# Patient Record
Sex: Female | Born: 1985 | Race: White | Hispanic: No | Marital: Single | Smoking: Never smoker
Health system: Southern US, Community
[De-identification: ages and names within clinical notes are randomized; demographics above are authoritative.]

## PROBLEM LIST (undated history)

## (undated) ENCOUNTER — Inpatient Hospital Stay (HOSPITAL_COMMUNITY): Payer: Self-pay

## (undated) DIAGNOSIS — S060X9A Concussion with loss of consciousness of unspecified duration, initial encounter: Secondary | ICD-10-CM

## (undated) DIAGNOSIS — Z87442 Personal history of urinary calculi: Secondary | ICD-10-CM

## (undated) DIAGNOSIS — Z8759 Personal history of other complications of pregnancy, childbirth and the puerperium: Secondary | ICD-10-CM

## (undated) DIAGNOSIS — B009 Herpesviral infection, unspecified: Secondary | ICD-10-CM

## (undated) DIAGNOSIS — O99891 Other specified diseases and conditions complicating pregnancy: Secondary | ICD-10-CM

## (undated) DIAGNOSIS — A749 Chlamydial infection, unspecified: Secondary | ICD-10-CM

## (undated) DIAGNOSIS — N200 Calculus of kidney: Secondary | ICD-10-CM

## (undated) DIAGNOSIS — F419 Anxiety disorder, unspecified: Secondary | ICD-10-CM

## (undated) DIAGNOSIS — E282 Polycystic ovarian syndrome: Secondary | ICD-10-CM

## (undated) DIAGNOSIS — E88819 Insulin resistance, unspecified: Secondary | ICD-10-CM

## (undated) DIAGNOSIS — Z9889 Other specified postprocedural states: Secondary | ICD-10-CM

## (undated) DIAGNOSIS — K589 Irritable bowel syndrome without diarrhea: Secondary | ICD-10-CM

## (undated) DIAGNOSIS — E669 Obesity, unspecified: Secondary | ICD-10-CM

## (undated) HISTORY — DX: Irritable bowel syndrome, unspecified: K58.9

## (undated) HISTORY — PX: BARIATRIC SURGERY: SHX1103

## (undated) HISTORY — PX: FINGER SURGERY: SHX640

## (undated) HISTORY — DX: Insulin resistance, unspecified: E88.819

## (undated) HISTORY — PX: BREAST ENHANCEMENT SURGERY: SHX7

## (undated) HISTORY — DX: Personal history of other complications of pregnancy, childbirth and the puerperium: Z87.59

## (undated) HISTORY — PX: WISDOM TOOTH EXTRACTION: SHX21

## (undated) HISTORY — DX: Herpesviral infection, unspecified: B00.9

## (undated) HISTORY — DX: Obesity, unspecified: E66.9

## (undated) HISTORY — DX: Chlamydial infection, unspecified: A74.9

## (undated) HISTORY — DX: Anxiety disorder, unspecified: F41.9

---

## 2009-04-13 DIAGNOSIS — A749 Chlamydial infection, unspecified: Secondary | ICD-10-CM

## 2009-04-13 HISTORY — DX: Chlamydial infection, unspecified: A74.9

## 2011-07-27 ENCOUNTER — Encounter: Payer: Self-pay | Admitting: Obstetrics & Gynecology

## 2011-07-27 ENCOUNTER — Ambulatory Visit (INDEPENDENT_AMBULATORY_CARE_PROVIDER_SITE_OTHER): Payer: BC Managed Care – PPO | Admitting: Obstetrics & Gynecology

## 2011-07-27 VITALS — BP 127/72 | HR 85 | Temp 98.5°F | Resp 16 | Ht 63.0 in | Wt 218.0 lb

## 2011-07-27 DIAGNOSIS — Z23 Encounter for immunization: Secondary | ICD-10-CM

## 2011-07-27 DIAGNOSIS — Z113 Encounter for screening for infections with a predominantly sexual mode of transmission: Secondary | ICD-10-CM

## 2011-07-27 DIAGNOSIS — N915 Oligomenorrhea, unspecified: Secondary | ICD-10-CM

## 2011-07-27 DIAGNOSIS — Z124 Encounter for screening for malignant neoplasm of cervix: Secondary | ICD-10-CM

## 2011-07-27 DIAGNOSIS — Z0000 Encounter for general adult medical examination without abnormal findings: Secondary | ICD-10-CM

## 2011-07-27 DIAGNOSIS — N912 Amenorrhea, unspecified: Secondary | ICD-10-CM

## 2011-07-27 DIAGNOSIS — Z01419 Encounter for gynecological examination (general) (routine) without abnormal findings: Secondary | ICD-10-CM

## 2011-07-27 NOTE — Progress Notes (Signed)
Subjective:    Adrienne Madden is a 26 y.o. female who presents for an annual exam. She would like to be pregnant. She quit her OCPs in 11/12 and hasn't had a period since. Normally, without OCPs she will have a period about every other month. The patient is sexually active. GYN screening history: last pap: was normal. The patient wears seatbelts: yes. The patient participates in regular exercise: yes. Has the patient ever been transfused or tattooed?: not asked. The patient reports that there is not domestic violence in her life.   Menstrual History: OB History    Grav Para Term Preterm Abortions TAB SAB Ect Mult Living   0 0 0 0 0 0 0 0 0 0       Menarche age: 66 Patient's last menstrual period was 02/16/2011.    The following portions of the patient's history were reviewed and updated as appropriate: allergies, current medications, past family history, past medical history, past social history, past surgical history and problem list.  Review of Systems A comprehensive review of systems was negative. Her wedding is scheduled for 10/13. She is a Production designer, theatre/television/film at a [employer]. She recently had her cholesterol checked and says it was normal. She was told in high school that she has high testosterone. She has to pluck hair above upper lip and has extra hair on her lower abdomen.   Objective:    BP 127/72  Pulse 85  Temp(Src) 98.5 F (36.9 C) (Oral)  Resp 16  Ht 5\' 3"  (1.6 m)  Wt 218 lb (98.884 kg)  BMI 38.62 kg/m2  LMP 02/16/2011  General Appearance:    Alert, cooperative, no distress, appears stated age  Head:    Normocephalic, without obvious abnormality, atraumatic  Eyes:    PERRL, conjunctiva/corneas clear, EOM's intact, fundi    benign, both eyes  Ears:    Normal TM's and external ear canals, both ears  Nose:   Nares normal, septum midline, mucosa normal, no drainage    or sinus tenderness  Throat:   Lips, mucosa, and tongue normal; teeth and gums normal  Neck:   Supple, symmetrical,  trachea midline, no adenopathy;    thyroid:  no enlargement/tenderness/nodules; no carotid   bruit or JVD  Back:     Symmetric, no curvature, ROM normal, no CVA tenderness  Lungs:     Clear to auscultation bilaterally, respirations unlabored  Chest Wall:    No tenderness or deformity   Heart:    Regular rate and rhythm, S1 and S2 normal, no murmur, rub   or gallop  Breast Exam:    No tenderness, masses, or nipple abnormality  Abdomen:     Soft, non-tender, bowel sounds active all four quadrants,    no masses, no organomegaly  Genitalia:    Normal female without lesion, discharge or tenderness  Rectal:    Normal tone, normal prostate, no masses or tenderness;   guaiac negative stool  Extremities:   Extremities normal, atraumatic, no cyanosis or edema  Pulses:   2+ and symmetric all extremities  Skin:   Skin color, texture, turgor normal, no rashes or lesions  Lymph nodes:   Cervical, supraclavicular, and axillary nodes normal  Neurologic:   CNII-XII intact, normal strength, sensation and reflexes    throughout  .    Assessment:    Healthy female exam.  Probable PCOS   Plan:     Pap smear.  Labs She will RTC in 2 weeks and I will probably start her  on metformin for ovulation induction.

## 2011-07-28 LAB — TSH: TSH: 0.538 u[IU]/mL (ref 0.350–4.500)

## 2011-07-28 LAB — HCG, QUANTITATIVE, PREGNANCY: hCG, Beta Chain, Quant, S: <2 m[IU]/mL

## 2011-07-28 LAB — FOLLICLE STIMULATING HORMONE: FSH: 5.3 m[IU]/mL

## 2011-07-28 LAB — TESTOSTERONE, FREE, TOTAL, SHBG: Testosterone-% Free: 2.3 % (ref 0.4–2.4)

## 2011-07-29 ENCOUNTER — Encounter: Payer: Self-pay | Admitting: Obstetrics & Gynecology

## 2011-08-18 ENCOUNTER — Ambulatory Visit (INDEPENDENT_AMBULATORY_CARE_PROVIDER_SITE_OTHER): Payer: BC Managed Care – PPO | Admitting: Obstetrics & Gynecology

## 2011-08-18 ENCOUNTER — Encounter: Payer: Self-pay | Admitting: Obstetrics & Gynecology

## 2011-08-18 VITALS — BP 126/81 | HR 74 | Ht 64.0 in | Wt 222.0 lb

## 2011-08-18 DIAGNOSIS — N970 Female infertility associated with anovulation: Secondary | ICD-10-CM

## 2011-08-18 DIAGNOSIS — E282 Polycystic ovarian syndrome: Secondary | ICD-10-CM

## 2011-08-18 MED ORDER — METFORMIN HCL 500 MG PO TABS: ORAL_TABLET | ORAL | Status: DC

## 2011-08-18 NOTE — Progress Notes (Signed)
  Subjective:    Patient ID: Adrienne Madden, female    DOB: [birth year], 26 y.o.   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 RTC 6 months I have encouraged weight loss.

## 2011-09-28 ENCOUNTER — Ambulatory Visit: Payer: BC Managed Care – PPO

## 2011-10-05 ENCOUNTER — Ambulatory Visit: Payer: BC Managed Care – PPO | Admitting: Family

## 2011-10-05 DIAGNOSIS — Z3009 Encounter for other general counseling and advice on contraception: Secondary | ICD-10-CM

## 2012-06-16 ENCOUNTER — Ambulatory Visit (INDEPENDENT_AMBULATORY_CARE_PROVIDER_SITE_OTHER): Payer: BC Managed Care – PPO | Admitting: Obstetrics & Gynecology

## 2012-06-16 ENCOUNTER — Encounter: Payer: Self-pay | Admitting: Obstetrics & Gynecology

## 2012-06-16 VITALS — BP 134/89 | HR 95 | Resp 16 | Ht 65.0 in | Wt 209.0 lb

## 2012-06-16 MED ORDER — METFORMIN HCL 500 MG PO TABS: ORAL_TABLET | ORAL | Status: DC

## 2012-06-16 MED ORDER — DROSPIRENONE-ETHINYL ESTRADIOL 3-0.02 MG PO TABS: 1.0000 | ORAL_TABLET | Freq: Every day | ORAL | Status: DC

## 2012-06-16 NOTE — Progress Notes (Signed)
  Subjective:    Patient ID: Adrienne Madden, female    DOB: 03/31/1986, 27 y.o.   MRN: [id number]  HPI  [name] is a SW 27 yo G0 who is on metformin for PCOS. She is no longer with her previous finance and has a new boyfriend. She is using condoms but he recently tested negative for STIs and they want to forego condoms. So she is here today to start on OCPs. We discussed the various types of contraception, and she does want OCPs. She wants to use yaz because of its anti androgenic effects. I have explained that all OCPs increase the risk of DVT/CVAs but that yaz is the highest risk. She still wishes to have this pill.   Review of Systems She has already had her flu vaccine this season. She says that her BP was normal yesterday and that she took a decongestant today.    Objective:   Physical Exam        Assessment & Plan:  Contraception- generic yaz Rec back up for a month (and when on antibiotics) She will come in next week for a BP check.

## 2013-04-13 HISTORY — PX: LITHOTRIPSY: SUR834

## 2013-09-28 ENCOUNTER — Ambulatory Visit: Payer: Self-pay | Admitting: Obstetrics & Gynecology

## 2013-11-23 ENCOUNTER — Ambulatory Visit (INDEPENDENT_AMBULATORY_CARE_PROVIDER_SITE_OTHER): Payer: Managed Care, Other (non HMO) | Admitting: Obstetrics & Gynecology

## 2013-11-23 ENCOUNTER — Encounter: Payer: Self-pay | Admitting: Obstetrics & Gynecology

## 2013-11-23 VITALS — BP 131/86 | HR 98 | Resp 16 | Ht 64.5 in | Wt 206.0 lb

## 2013-11-23 DIAGNOSIS — E282 Polycystic ovarian syndrome: Secondary | ICD-10-CM

## 2013-11-23 DIAGNOSIS — Z01419 Encounter for gynecological examination (general) (routine) without abnormal findings: Secondary | ICD-10-CM

## 2013-11-23 DIAGNOSIS — Z124 Encounter for screening for malignant neoplasm of cervix: Secondary | ICD-10-CM

## 2013-11-23 DIAGNOSIS — Z113 Encounter for screening for infections with a predominantly sexual mode of transmission: Secondary | ICD-10-CM

## 2013-11-23 DIAGNOSIS — E669 Obesity, unspecified: Secondary | ICD-10-CM | POA: Insufficient documentation

## 2013-11-23 DIAGNOSIS — Z0000 Encounter for general adult medical examination without abnormal findings: Secondary | ICD-10-CM

## 2013-11-23 MED ORDER — METFORMIN HCL 1000 MG PO TABS: ORAL_TABLET | ORAL | Status: DC

## 2013-11-23 NOTE — Progress Notes (Signed)
Subjective:    Adrienne Madden is a 28 y.o. female who presents for an annual exam. She says that she ran out of her metformin about 8 months ago and she would like a refill. She got married and wants a pregnacy but doesn't have periods very often. (Last was June and maybe today). The patient has no complaints today. The patient is sexually active. GYN screening history: last pap: was normal. The patient wears seatbelts: yes. The patient participates in regular exercise: yes. (walking) Has the patient ever been transfused or tattooed?: yes. (tattoo) The patient reports that there is not domestic violence in her life.   Menstrual History: OB History   Grav Para Term Preterm Abortions TAB SAB Ect Mult Living   0 0 0 0 0 0 0 0 0 0       Menarche age: 63 Patient's last menstrual period was 10/02/2013.    The following portions of the patient's history were reviewed and updated as appropriate: allergies, current medications, past family history, past medical history, past social history, past surgical history and problem list.  Review of Systems A comprehensive review of systems was negative.    Objective:    BP 131/86  Pulse 98  Resp 16  Ht 5' 4.5" (1.638 m)  Wt 206 lb (93.441 kg)  BMI 34.83 kg/m2  LMP 10/02/2013  General Appearance:    Alert, cooperative, no distress, appears stated age  Head:    Normocephalic, without obvious abnormality, atraumatic  Eyes:    PERRL, conjunctiva/corneas clear, EOM's intact, fundi    benign, both eyes  Ears:    Normal TM's and external ear canals, both ears  Nose:   Nares normal, septum midline, mucosa normal, no drainage    or sinus tenderness  Throat:   Lips, mucosa, and tongue normal; teeth and gums normal  Neck:   Supple, symmetrical, trachea midline, no adenopathy;    thyroid:  no enlargement/tenderness/nodules; no carotid   bruit or JVD  Back:     Symmetric, no curvature, ROM normal, no CVA tenderness  Lungs:     Clear to auscultation  bilaterally, respirations unlabored  Chest Wall:    No tenderness or deformity   Heart:    Regular rate and rhythm, S1 and S2 normal, no murmur, rub   or gallop  Breast Exam:    No tenderness, masses, or nipple abnormality  Abdomen:     Soft, non-tender, bowel sounds active all four quadrants,    no masses, no organomegaly  Genitalia:    Normal female without lesion, discharge or tenderness, NSSA, starting a period today, NT, mobile, normal adnexal exam     Extremities:   Extremities normal, atraumatic, no cyanosis or edema  Pulses:   2+ and symmetric all extremities  Skin:   Skin color, texture, turgor normal, no rashes or lesions  Lymph nodes:   Cervical, supraclavicular, and axillary nodes normal  Neurologic:   CNII-XII intact, normal strength, sensation and reflexes    throughout  .    Assessment:    Healthy female exam.  PCOS and desires fertility. Husband had sperm check and is "POTENT".   Plan:     Breast self exam technique reviewed and patient encouraged to perform self-exam monthly. Thin prep Pap smear.  Restart metformin and if no pregnancy in the next 8 months, rec referral to Dr. Kerin Perna

## 2013-11-27 LAB — CYTOLOGY - PAP

## 2014-01-04 ENCOUNTER — Other Ambulatory Visit: Payer: Self-pay | Admitting: Urology

## 2014-01-19 ENCOUNTER — Encounter (HOSPITAL_COMMUNITY): Payer: Self-pay | Admitting: *Deleted

## 2014-01-22 ENCOUNTER — Encounter (HOSPITAL_COMMUNITY): Payer: Self-pay

## 2014-01-22 ENCOUNTER — Ambulatory Visit (HOSPITAL_COMMUNITY): Payer: Managed Care, Other (non HMO)

## 2014-01-22 ENCOUNTER — Ambulatory Visit (HOSPITAL_COMMUNITY)
Admission: RE | Admit: 2014-01-22 | Discharge: 2014-01-22 | Disposition: A | Discharge status: Home / Self Care | Payer: Managed Care, Other (non HMO) | Source: Ambulatory Visit | Attending: Urology | Admitting: Urology

## 2014-01-22 ENCOUNTER — Encounter (HOSPITAL_COMMUNITY)
Admission: RE | Disposition: A | Discharge status: Home / Self Care | Payer: Self-pay | Source: Ambulatory Visit | Attending: Urology

## 2014-01-22 DIAGNOSIS — E282 Polycystic ovarian syndrome: Secondary | ICD-10-CM | POA: Insufficient documentation

## 2014-01-22 DIAGNOSIS — G4730 Sleep apnea, unspecified: Secondary | ICD-10-CM | POA: Diagnosis not present

## 2014-01-22 DIAGNOSIS — N1330 Unspecified hydronephrosis: Secondary | ICD-10-CM | POA: Diagnosis not present

## 2014-01-22 DIAGNOSIS — N202 Calculus of kidney with calculus of ureter: Secondary | ICD-10-CM | POA: Diagnosis not present

## 2014-01-22 DIAGNOSIS — I10 Essential (primary) hypertension: Secondary | ICD-10-CM | POA: Diagnosis not present

## 2014-01-22 DIAGNOSIS — Z79899 Other long term (current) drug therapy: Secondary | ICD-10-CM | POA: Insufficient documentation

## 2014-01-22 DIAGNOSIS — N200 Calculus of kidney: Secondary | ICD-10-CM

## 2014-01-22 DIAGNOSIS — N3020 Other chronic cystitis without hematuria: Secondary | ICD-10-CM | POA: Insufficient documentation

## 2014-01-22 HISTORY — DX: Polycystic ovarian syndrome: E28.2

## 2014-01-22 LAB — PREGNANCY, URINE: PREG TEST UR: NEGATIVE

## 2014-01-22 LAB — GLUCOSE, CAPILLARY: Glucose-Capillary: 130 mg/dL — ABNORMAL HIGH (ref 70–99)

## 2014-01-22 SURGERY — LITHOTRIPSY, ESWL
Anesthesia: LOCAL | Laterality: Right

## 2014-01-22 MED ORDER — DIPHENHYDRAMINE HCL 25 MG PO CAPS
25.0000 mg | ORAL_CAPSULE | ORAL | Status: AC
  Administered 2014-01-22: 25 mg via ORAL
  Filled 2014-01-22: qty 1

## 2014-01-22 MED ORDER — OXYCODONE-ACETAMINOPHEN 5-325 MG PO TABS
1.0000 | ORAL_TABLET | Freq: Once | ORAL | Status: AC
  Administered 2014-01-22: 1 via ORAL
  Filled 2014-01-22: qty 1

## 2014-01-22 MED ORDER — OXYCODONE-ACETAMINOPHEN 5-325 MG PO TABS: 1.0000 | ORAL_TABLET | Freq: Four times a day (QID) | ORAL | Status: DC | PRN

## 2014-01-22 MED ORDER — CIPROFLOXACIN HCL 500 MG PO TABS
500.0000 mg | ORAL_TABLET | ORAL | Status: AC
  Administered 2014-01-22: 500 mg via ORAL
  Filled 2014-01-22: qty 1

## 2014-01-22 MED ORDER — DEXTROSE-NACL 5-0.45 % IV SOLN
INTRAVENOUS | Status: DC
  Administered 2014-01-22: 10:00:00 via INTRAVENOUS

## 2014-01-22 MED ORDER — DIAZEPAM 5 MG PO TABS
10.0000 mg | ORAL_TABLET | ORAL | Status: AC
  Administered 2014-01-22: 10 mg via ORAL
  Filled 2014-01-22: qty 2

## 2014-01-22 NOTE — Discharge Instructions (Signed)
See [facility] discharge instructions in chart.  

## 2014-01-22 NOTE — H&P (Signed)
History of Present Illness   [name] presents today as a self-referral for ongoing evaluation of apparent recent diagnosis of nephrolithiasis with intermittent right lower flank discomfort, nausea, and occasional emesis. [name] is currently 28 years of age. She does have a strong family history of nephrolithiasis. Back in May 2015, she presented to the [facility] Emergency Room with significant flank discomfort. At that time she was noted to have significant pyuria and microhematuria. A renal ultrasound was performed. This did show some right-sided renal calculi. They were felt to be about 6 mm in size. There was also felt to be some hydronephrosis that was mild on the right side. Apparently, the urologist on call for the [facility] system was contacted, but she tells me she was never given any followup information. She has been busy with work. Her severe pain resolved, but she has continued to intermittently have discomfort. She has had gross hematuria as well as some intermittent nausea. Urinalysis today shows ongoing significant pyuria. Of note, her urine culture at [facility] was actually negative for definitive uropathogen. Renal function was normal.      Past Medical History Problems  1. History of hypertension (V12.59) 2. History of polycystic ovarian syndrome (V13.29) 3. History of sleep apnea (V13.89)  Surgical History Problems  1. History of Hand Surgery 2. History of Oral Surgery Tooth Extraction  Current Meds 1. Advil TABS;  Therapy: (Recorded:24Sep2015) to Recorded 2. MetFORMIN HCl TABS;  Therapy: (Recorded:24Sep2015) to Recorded  Allergies Medication  1. No Known Drug Allergies  Family History Problems  1. Family history of Benign hematuria : Father 2. Family history of kidney stones (V18.69) : Father, Brother 3. No significant family history : Mother  Social History Problems    Denied: History of Alcohol use   Denied: History of Caffeine use   Father's  age   31yrs   Married   Mother's age   65yrs   Never a smoker   Denied: History of Number of children   Occupation   Network engineer  Review of Systems Genitourinary, constitutional, skin, eye, otolaryngeal, hematologic/lymphatic, cardiovascular, pulmonary, endocrine, musculoskeletal, gastrointestinal, neurological and psychiatric system(s) were reviewed and pertinent findings if present are noted.  Genitourinary: urinary frequency, urinary urgency, nocturia and hematuria.  Gastrointestinal: flank pain and constipation.  Musculoskeletal: back pain and joint pain.    Vitals Vital Signs [Data Includes: Last 1 Day]  Recorded: 24Sep2015 09:10AM  Height: 5 ft 3.25 in Weight: 210 lb  BMI Calculated: 36.9 BSA Calculated: 1.98 Blood Pressure: 109 / 68 Temperature: 97.9 F Heart Rate: 74  Physical Exam Constitutional: Well nourished and well developed . No acute distress.  ENT:. The ears and nose are normal in appearance.  Pulmonary: No respiratory distress and normal respiratory rhythm and effort.  Cardiovascular: Heart rate and rhythm are normal . No peripheral edema.  Abdomen: The abdomen is soft and nontender. No masses are palpated. No CVA tenderness. No hernias are palpable. No hepatosplenomegaly noted.  Skin: Normal skin turgor, no visible rash and no visible skin lesions.  Neuro/Psych:. Mood and affect are appropriate.    Results/Data Urine [Data Includes: Last 1 Day]   24Sep2015  COLOR YELLOW   APPEARANCE CLOUDY   SPECIFIC GRAVITY 1.020   pH 6.5   GLUCOSE NEG mg/dL  BILIRUBIN NEG   KETONE NEG mg/dL  BLOOD SMALL   PROTEIN 100 mg/dL  UROBILINOGEN 0.2 mg/dL  NITRITE NEG   LEUKOCYTE ESTERASE MOD   SQUAMOUS EPITHELIAL/HPF MODERATE   WBC 21-50 WBC/hpf  RBC NONE SEEN RBC/hpf  BACTERIA MODERATE   CRYSTALS NONE SEEN   CASTS NONE SEEN    Assessment Assessed  1. Hydronephrosis (591) 2. Ureteral calculus (592.1) 3. Nephrolithiasis (592.0) 4. Chronic cystitis  (595.2)  Plan Chronic cystitis  1. AU CT-STONE PROTOCOL; Status:In Progress - Specimen/Data Collected;   Done:  24Sep2015 12:00AM Health Maintenance  2. UA With REFLEX; [Do Not Release]; Status:Complete;   Done: [id number] 08:50AM  Discussion/Summary   [name] underwent stone protocol imaging today. The official reading is pending, but I reviewed the images. She appears to have two 8 mm stones in a right collecting system. One appears to be in the renal pelvis and may indeed be ball-valving in and out of the pelvis. She did not appear to have extensive hydronephrosis at this time. There are no obvious ureteral or bladder calculi. There is nothing obvious on the contralateral side. Urinalysis today shows ongoing significant pyuria, but she is asymptomatic. Her urinalysis last time was similar and the culture was negative. The pyuria may be just inflammation from the stones. It is impossible to know with certainty if her pain is indeed due to the stones, but I think it is highly likely and certainly given the size and location of these stones as well as her age, I would strongly recommend intervention. Ureteroscopy is an option as is ESWL. Hounsfield units on the stones appears to be in the 700-900 range. I do think she is a good candidate for ESWL, but certainly there maybe some stone pieces that she could have difficulty passing and certainly the possibility of a secondary procedure does exist. Personally, I would want ESWL if it were me with these particular stones currently. She will certainly need to have antibiotics if her urine culture is positive and will want to treat her and make sure there is no active infection prior to ESWL. We will try to set something up for sometime in the next few weeks in accordance with her schedule as well as mine.

## 2014-01-22 NOTE — Op Note (Signed)
See [facility] OP note scanned into chart. 

## 2014-01-22 NOTE — Interval H&P Note (Signed)
History and Physical Interval Note:  79/15/0569 79:48 PM  Adrienne Madden  has presented today for surgery, with the diagnosis of Right Renal Calculi  The various methods of treatment have been discussed with the patient and family. After consideration of risks, benefits and other options for treatment, the patient has consented to  Procedure(s): RIGHT EXTRACORPOREAL SHOCK WAVE LITHOTRIPSY (ESWL) (Right) as a surgical intervention .  The patient's history has been reviewed, patient examined, no change in status, stable for surgery.  I have reviewed the patient's chart and labs.  Questions were answered to the patient's satisfaction.     Hye Trawick S

## 2015-11-21 ENCOUNTER — Encounter: Payer: Self-pay | Admitting: Obstetrics & Gynecology

## 2015-11-21 ENCOUNTER — Encounter (INDEPENDENT_AMBULATORY_CARE_PROVIDER_SITE_OTHER): Payer: Self-pay

## 2015-11-21 ENCOUNTER — Ambulatory Visit (INDEPENDENT_AMBULATORY_CARE_PROVIDER_SITE_OTHER): Payer: Managed Care, Other (non HMO) | Admitting: Obstetrics & Gynecology

## 2015-11-21 VITALS — BP 122/84 | Ht 65.0 in | Wt 218.0 lb

## 2015-11-21 DIAGNOSIS — N898 Other specified noninflammatory disorders of vagina: Secondary | ICD-10-CM | POA: Diagnosis not present

## 2015-11-21 DIAGNOSIS — Z124 Encounter for screening for malignant neoplasm of cervix: Secondary | ICD-10-CM

## 2015-11-21 DIAGNOSIS — Z01419 Encounter for gynecological examination (general) (routine) without abnormal findings: Secondary | ICD-10-CM

## 2015-11-21 DIAGNOSIS — Z1151 Encounter for screening for human papillomavirus (HPV): Secondary | ICD-10-CM

## 2015-11-21 MED ORDER — MISOPROSTOL 200 MCG PO TABS: ORAL_TABLET | ORAL | 0 refills | Status: DC

## 2015-11-21 NOTE — Addendum Note (Signed)
Addended by: Lyndal Rainbow on: 11/21/2015 02:33 PM   Modules accepted: Orders

## 2015-11-21 NOTE — Progress Notes (Signed)
Subjective:     Adrienne Madden is a 30 y.o. female here for a routine exam.  Current complaints: wants to try a Mirena.  Has PCOS and irregualr menses..   Gynecologic History Patient's last menstrual period was 11/11/2015 (exact date). Contraception: none Last Pap: 2015. Results were: normal  Obstetric History OB History  Gravida Para Term Preterm AB Living  0 0 0 0 0 0  SAB TAB Ectopic Multiple Live Births  0 0 0 0           The following portions of the patient's history were reviewed and updated as appropriate: allergies, current medications, past family history, past medical history, past social history, past surgical history and problem list.  Review of Systems Pertinent items noted in HPI and remainder of comprehensive ROS otherwise negative.    Objective:      Vitals:   11/21/15 1337  BP: 122/84  Weight: 218 lb (98.9 kg)  Height: 5\' 5"  (1.651 m)   Vitals:  WNL General appearance: alert, cooperative and no distress  HEENT: Normocephalic, without obvious abnormality, atraumatic Eyes: negative Throat: lips, mucosa, and tongue normal; teeth and gums normal  Respiratory: Clear to auscultation bilaterally  CV: Regular rate and rhythm  Breasts:  Normal appearance, no masses or tenderness, no nipple retraction or dimpling  GI: Soft, non-tender; bowel sounds normal; no masses,  no organomegaly  GU: External Genitalia:  Tanner V, no lesion Urethra:  No prolapse   Vagina: Pink, normal rugae, no blood, white chunky discharge adherent to side walls  Cervix: No CMT, no lesion, mild bleedign after pap  Uterus:  Normal size and contour, non tender  Adnexa: Normal, no masses, non tender  Musculoskeletal: No edema, redness or tenderness in the calves or thighs  Skin: No lesions or rash  Lymphatic: Axillary adenopathy: none    Psychiatric: Normal mood and behavior    Assessment:    Healthy female exam.    Plan:   Pap smear with reflex HPV Wet prep to evaluate for  yeast Mirena with cytotec and Aleve No unprotected sex for 2 weeks prior to IUD insertion.

## 2015-11-21 NOTE — Addendum Note (Signed)
Addended by: Lyndal Rainbow on: 11/21/2015 02:38 PM   Modules accepted: Orders

## 2015-11-27 LAB — WET PREP, GENITAL
CLUE CELLS WET PREP: NONE SEEN
Trich, Wet Prep: NONE SEEN

## 2015-11-27 LAB — CYTOLOGY - PAP

## 2015-11-28 ENCOUNTER — Other Ambulatory Visit: Payer: Self-pay

## 2015-11-28 ENCOUNTER — Telehealth: Payer: Self-pay

## 2015-11-28 DIAGNOSIS — B379 Candidiasis, unspecified: Secondary | ICD-10-CM

## 2015-11-28 MED ORDER — FLUCONAZOLE 150 MG PO TABS: 150.0000 mg | ORAL_TABLET | Freq: Once | ORAL | 0 refills | Status: AC

## 2015-11-28 NOTE — Telephone Encounter (Signed)
I have called the pt multiple times to tell her that she has a yeast infection and medication has been called in for her. Every time I call, I get a busy tone.

## 2015-11-28 NOTE — Telephone Encounter (Signed)
Spoke with pt and she is aware that she has a yeast infection and that the medication has been sent to her pharmacy

## 2015-12-17 ENCOUNTER — Telehealth: Payer: Self-pay | Admitting: *Deleted

## 2015-12-17 DIAGNOSIS — N926 Irregular menstruation, unspecified: Secondary | ICD-10-CM

## 2015-12-17 MED ORDER — DROSPIRENONE-ETHINYL ESTRADIOL 3-0.02 MG PO TABS: 1.0000 | ORAL_TABLET | Freq: Every day | ORAL | 11 refills | Status: DC

## 2015-12-17 NOTE — Telephone Encounter (Signed)
Pt called to cancel her IUD insert.  She has decided she doesn't want the IUD but wants to go back on Yaz.  [facility] [place].

## 2015-12-19 ENCOUNTER — Ambulatory Visit: Payer: Managed Care, Other (non HMO) | Admitting: Obstetrics & Gynecology

## 2016-02-12 DIAGNOSIS — S060XAA Concussion with loss of consciousness status unknown, initial encounter: Secondary | ICD-10-CM

## 2016-02-12 DIAGNOSIS — S060X9A Concussion with loss of consciousness of unspecified duration, initial encounter: Secondary | ICD-10-CM

## 2016-02-12 HISTORY — DX: Concussion with loss of consciousness status unknown, initial encounter: S06.0XAA

## 2016-02-12 HISTORY — DX: Concussion with loss of consciousness of unspecified duration, initial encounter: S06.0X9A

## 2016-08-04 ENCOUNTER — Other Ambulatory Visit: Payer: Self-pay | Admitting: Urology

## 2016-08-05 ENCOUNTER — Encounter (HOSPITAL_COMMUNITY): Payer: Self-pay | Admitting: *Deleted

## 2016-08-10 ENCOUNTER — Ambulatory Visit (HOSPITAL_COMMUNITY): Payer: Commercial Managed Care - PPO

## 2016-08-10 ENCOUNTER — Ambulatory Visit (HOSPITAL_COMMUNITY)
Admission: RE | Admit: 2016-08-10 | Discharge: 2016-08-10 | Disposition: A | Discharge status: Home / Self Care | Payer: Commercial Managed Care - PPO | Source: Ambulatory Visit | Attending: Urology | Admitting: Urology

## 2016-08-10 ENCOUNTER — Encounter (HOSPITAL_COMMUNITY): Payer: Self-pay | Admitting: General Practice

## 2016-08-10 ENCOUNTER — Encounter (HOSPITAL_COMMUNITY)
Admission: RE | Disposition: A | Discharge status: Home / Self Care | Payer: Self-pay | Source: Ambulatory Visit | Attending: Urology

## 2016-08-10 DIAGNOSIS — Z87442 Personal history of urinary calculi: Secondary | ICD-10-CM | POA: Insufficient documentation

## 2016-08-10 DIAGNOSIS — N200 Calculus of kidney: Secondary | ICD-10-CM | POA: Insufficient documentation

## 2016-08-10 DIAGNOSIS — Z79899 Other long term (current) drug therapy: Secondary | ICD-10-CM | POA: Diagnosis not present

## 2016-08-10 DIAGNOSIS — Z7984 Long term (current) use of oral hypoglycemic drugs: Secondary | ICD-10-CM | POA: Diagnosis not present

## 2016-08-10 DIAGNOSIS — Z793 Long term (current) use of hormonal contraceptives: Secondary | ICD-10-CM | POA: Insufficient documentation

## 2016-08-10 DIAGNOSIS — E669 Obesity, unspecified: Secondary | ICD-10-CM | POA: Insufficient documentation

## 2016-08-10 DIAGNOSIS — Z6837 Body mass index (BMI) 37.0-37.9, adult: Secondary | ICD-10-CM | POA: Diagnosis not present

## 2016-08-10 HISTORY — DX: Personal history of urinary calculi: Z87.442

## 2016-08-10 HISTORY — PX: EXTRACORPOREAL SHOCK WAVE LITHOTRIPSY: SHX1557

## 2016-08-10 HISTORY — DX: Concussion with loss of consciousness of unspecified duration, initial encounter: S06.0X9A

## 2016-08-10 LAB — PREGNANCY, URINE: PREG TEST UR: NEGATIVE

## 2016-08-10 SURGERY — LITHOTRIPSY, ESWL
Anesthesia: LOCAL | Laterality: Right

## 2016-08-10 MED ORDER — CIPROFLOXACIN HCL 500 MG PO TABS
500.0000 mg | ORAL_TABLET | ORAL | Status: AC
  Administered 2016-08-10: 500 mg via ORAL
  Filled 2016-08-10: qty 1

## 2016-08-10 MED ORDER — DIAZEPAM 5 MG PO TABS
10.0000 mg | ORAL_TABLET | ORAL | Status: AC
Start: 2016-08-10 — End: 2016-08-10
  Administered 2016-08-10: 10 mg via ORAL
  Filled 2016-08-10: qty 2

## 2016-08-10 MED ORDER — DIPHENHYDRAMINE HCL 25 MG PO CAPS
25.0000 mg | ORAL_CAPSULE | ORAL | Status: AC
  Administered 2016-08-10: 25 mg via ORAL
  Filled 2016-08-10: qty 1

## 2016-08-10 MED ORDER — SODIUM CHLORIDE 0.9 % IV SOLN
INTRAVENOUS | Status: DC
  Administered 2016-08-10: 08:00:00 via INTRAVENOUS

## 2016-08-10 NOTE — H&P (Signed)
  9:77 AM   Grafton Folk [birth year] [id number]   CC: Right renal stone  HPI: The patient is a 31 y.o.Adrienne Madden female with a 7 mm right renal stone who presents for ESWL. Patient with a negative urine culture.      PMH: Past Medical History:  Diagnosis Date  . Chlamydia 2011  . Concussion 02/2016  . History of kidney stones   . IBS (irritable bowel syndrome)   . Obesity   . Polycystic ovarian syndrome     Surgical History: Past Surgical History:  Procedure Laterality Date  . FINGER SURGERY     rt hand cyst  . LITHOTRIPSY  2015  . WISDOM TOOTH EXTRACTION      Home Medications:    Allergies:  Allergies  Allergen Reactions  . Lactose Intolerance (Gi) Diarrhea  . Hydrocodone Nausea And Vomiting    Family History: Family History  Problem Relation Age of Onset  . Diabetes Maternal Grandmother   . Diabetes Paternal Grandmother   . Cancer Paternal Grandmother     lung/bile duct  . Cancer Paternal Grandfather     bile duct    Social History:  reports that she has never smoked. She has never used smokeless tobacco. She reports that she does not drink alcohol or use drugs.  ROS: 12 point ROS otherwise negative                                        Physical Exam: BP 117/74   Pulse 84   Temp 98.3 F (36.8 C) (Oral)   Resp 16   Ht 5\' 4"  (1.626 m)   Wt 217 lb 2 oz (98.5 kg)   LMP 07/24/2016 Comment: negative preg test today 08/10/2016  SpO2 98%   BMI 37.27 kg/m   Constitutional:  Alert and oriented, No acute distress. HEENT: [place] AT, moist mucus membranes.  Trachea midline, no masses. Cardiovascular: No clubbing, cyanosis, or edema. Respiratory: Normal respiratory effort, no increased work of breathing. GI: Abdomen is soft, nontender, nondistended, no abdominal masses GU: No CVA tenderness.  Skin: No rashes, bruises or suspicious lesions. Lymph: No cervical or inguinal adenopathy. Neurologic: Grossly intact, no focal deficits, moving all  4 extremities. Psychiatric: Normal mood and affect.  Laboratory Data: No results found for: WBC, HGB, HCT, MCV, PLT  No results found for: CREATININE  No results found for: PSA  Lab Results  Component Value Date   TESTOSTERONE 65.79 07/27/2011    No results found for: HGBA1C  Urinalysis No results found for: COLORURINE, APPEARANCEUR, LABSPEC, PHURINE, GLUCOSEU, HGBUR, BILIRUBINUR, KETONESUR, PROTEINUR, UROBILINOGEN, NITRITE, LEUKOCYTESUR  Pertinent Imaging: Right renal stone on KUB  Assessment & Plan:    1. Right 7 mm renal stone -Patient elects to proceed with right ESWL   Nickie Retort, MD

## 2016-08-11 ENCOUNTER — Encounter (HOSPITAL_COMMUNITY): Payer: Self-pay | Admitting: Urology

## 2016-08-17 ENCOUNTER — Ambulatory Visit (INDEPENDENT_AMBULATORY_CARE_PROVIDER_SITE_OTHER): Payer: Commercial Managed Care - PPO | Admitting: Obstetrics & Gynecology

## 2016-08-17 ENCOUNTER — Encounter: Payer: Self-pay | Admitting: Obstetrics & Gynecology

## 2016-08-17 VITALS — BP 116/76 | HR 78 | Ht 64.0 in | Wt 224.0 lb

## 2016-08-17 DIAGNOSIS — D271 Benign neoplasm of left ovary: Secondary | ICD-10-CM | POA: Diagnosis not present

## 2016-08-17 NOTE — Progress Notes (Signed)
   Subjective:    Patient ID: Adrienne Madden, female    DOB: 03/15/1986, 31 y.o.   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 This was an incidental finding during her work up for kidney stones.   Review of Systems She is a Engineer, manufacturing systems. Denies dyspareunia No abdominal surgeries in the past. Last pap 11/17 and normal    Objective:   Physical Exam [id number] Breathing, conversing, and ambulating normally      Assessment & Plan:  Left dermoid- rec l/s removal. I will send Jordan an email to schedule this.

## 2016-08-20 ENCOUNTER — Encounter (HOSPITAL_COMMUNITY): Payer: Self-pay

## 2016-10-05 ENCOUNTER — Encounter (HOSPITAL_COMMUNITY): Payer: Self-pay | Admitting: *Deleted

## 2016-10-13 ENCOUNTER — Ambulatory Visit (HOSPITAL_COMMUNITY): Payer: Commercial Managed Care - PPO | Admitting: Registered Nurse

## 2016-10-13 ENCOUNTER — Ambulatory Visit (HOSPITAL_COMMUNITY)
Admission: RE | Admit: 2016-10-13 | Discharge: 2016-11-02 | Disposition: A | Discharge status: Home / Self Care | Payer: Commercial Managed Care - PPO | Source: Ambulatory Visit | Attending: Obstetrics & Gynecology | Admitting: Obstetrics & Gynecology

## 2016-10-13 ENCOUNTER — Encounter (HOSPITAL_COMMUNITY)
Admission: RE | Disposition: A | Discharge status: Home / Self Care | Payer: Self-pay | Source: Ambulatory Visit | Attending: Obstetrics & Gynecology

## 2016-10-13 ENCOUNTER — Encounter (HOSPITAL_COMMUNITY): Payer: Self-pay | Admitting: *Deleted

## 2016-10-13 DIAGNOSIS — Z833 Family history of diabetes mellitus: Secondary | ICD-10-CM | POA: Insufficient documentation

## 2016-10-13 DIAGNOSIS — Z6838 Body mass index (BMI) 38.0-38.9, adult: Secondary | ICD-10-CM | POA: Diagnosis not present

## 2016-10-13 DIAGNOSIS — E739 Lactose intolerance, unspecified: Secondary | ICD-10-CM | POA: Diagnosis not present

## 2016-10-13 DIAGNOSIS — Z800 Family history of malignant neoplasm of digestive organs: Secondary | ICD-10-CM | POA: Insufficient documentation

## 2016-10-13 DIAGNOSIS — D271 Benign neoplasm of left ovary: Secondary | ICD-10-CM | POA: Diagnosis not present

## 2016-10-13 DIAGNOSIS — Z87442 Personal history of urinary calculi: Secondary | ICD-10-CM | POA: Insufficient documentation

## 2016-10-13 DIAGNOSIS — Z79899 Other long term (current) drug therapy: Secondary | ICD-10-CM | POA: Diagnosis not present

## 2016-10-13 DIAGNOSIS — K589 Irritable bowel syndrome without diarrhea: Secondary | ICD-10-CM | POA: Diagnosis not present

## 2016-10-13 DIAGNOSIS — E282 Polycystic ovarian syndrome: Secondary | ICD-10-CM | POA: Diagnosis present

## 2016-10-13 DIAGNOSIS — Z801 Family history of malignant neoplasm of trachea, bronchus and lung: Secondary | ICD-10-CM | POA: Insufficient documentation

## 2016-10-13 DIAGNOSIS — Z885 Allergy status to narcotic agent status: Secondary | ICD-10-CM | POA: Insufficient documentation

## 2016-10-13 HISTORY — PX: LAPAROSCOPIC OVARIAN CYSTECTOMY: SHX6248

## 2016-10-13 LAB — CBC
HEMATOCRIT: 43.5 % (ref 36.0–46.0)
Hemoglobin: 15 g/dL (ref 12.0–15.0)
MCH: 30.4 pg (ref 26.0–34.0)
MCHC: 34.5 g/dL (ref 30.0–36.0)
MCV: 88.2 fL (ref 78.0–100.0)
PLATELETS: 295 10*3/uL (ref 150–400)
RBC: 4.93 MIL/uL (ref 3.87–5.11)
RDW: 12.4 % (ref 11.5–15.5)
WBC: 8.4 10*3/uL (ref 4.0–10.5)

## 2016-10-13 LAB — PREGNANCY, URINE: PREG TEST UR: NEGATIVE

## 2016-10-13 SURGERY — EXCISION, CYST, OVARY, LAPAROSCOPIC
Anesthesia: General | Laterality: Left

## 2016-10-13 MED ORDER — SCOPOLAMINE 1 MG/3DAYS TD PT72
1.0000 | MEDICATED_PATCH | Freq: Once | TRANSDERMAL | Status: AC
  Administered 2016-10-13: 1.5 mg via TRANSDERMAL

## 2016-10-13 MED ORDER — SUGAMMADEX SODIUM 200 MG/2ML IV SOLN
INTRAVENOUS | Status: AC
  Filled 2016-10-13: qty 2

## 2016-10-13 MED ORDER — OXYCODONE-ACETAMINOPHEN 5-325 MG PO TABS: 1.0000 | ORAL_TABLET | Freq: Four times a day (QID) | ORAL | 0 refills | Status: DC | PRN

## 2016-10-13 MED ORDER — SCOPOLAMINE 1 MG/3DAYS TD PT72
MEDICATED_PATCH | TRANSDERMAL | Status: AC
  Filled 2016-10-13: qty 1

## 2016-10-13 MED ORDER — LIDOCAINE HCL (CARDIAC) 20 MG/ML IV SOLN
INTRAVENOUS | Status: AC
Start: 2016-10-13 — End: 2016-10-13
  Filled 2016-10-13: qty 5

## 2016-10-13 MED ORDER — LIDOCAINE 2% (20 MG/ML) 5 ML SYRINGE
INTRAMUSCULAR | Status: DC | PRN
  Administered 2016-10-13: 100 mg via INTRAVENOUS

## 2016-10-13 MED ORDER — ONDANSETRON HCL 4 MG/2ML IJ SOLN
INTRAMUSCULAR | Status: AC
  Filled 2016-10-13: qty 2

## 2016-10-13 MED ORDER — PROMETHAZINE HCL 25 MG/ML IJ SOLN
INTRAMUSCULAR | Status: AC
  Administered 2016-10-13: 6.25 mg via INTRAVENOUS
  Filled 2016-10-13: qty 1

## 2016-10-13 MED ORDER — MIDAZOLAM HCL 2 MG/2ML IJ SOLN
INTRAMUSCULAR | Status: AC
  Filled 2016-10-13: qty 2

## 2016-10-13 MED ORDER — KETOROLAC TROMETHAMINE 30 MG/ML IJ SOLN
INTRAMUSCULAR | Status: DC | PRN
  Administered 2016-10-13: 30 mg via INTRAVENOUS

## 2016-10-13 MED ORDER — PROPOFOL 10 MG/ML IV BOLUS
INTRAVENOUS | Status: DC | PRN
  Administered 2016-10-13: 200 mg via INTRAVENOUS

## 2016-10-13 MED ORDER — ACETAMINOPHEN 160 MG/5ML PO SOLN: 325.0000 mg | ORAL | Status: DC | PRN

## 2016-10-13 MED ORDER — PROMETHAZINE HCL 25 MG/ML IJ SOLN
6.2500 mg | INTRAMUSCULAR | Status: DC | PRN
  Administered 2016-10-13: 6.25 mg via INTRAVENOUS

## 2016-10-13 MED ORDER — IBUPROFEN 600 MG PO TABS: 600.0000 mg | ORAL_TABLET | Freq: Four times a day (QID) | ORAL | 1 refills | Status: DC | PRN

## 2016-10-13 MED ORDER — ONDANSETRON HCL 4 MG/2ML IJ SOLN
4.0000 mg | Freq: Once | INTRAMUSCULAR | Status: AC | PRN
  Administered 2016-10-13: 4 mg via INTRAVENOUS

## 2016-10-13 MED ORDER — MIDAZOLAM HCL 5 MG/5ML IJ SOLN
INTRAMUSCULAR | Status: DC | PRN
  Administered 2016-10-13: 2 mg via INTRAVENOUS

## 2016-10-13 MED ORDER — PROPOFOL 10 MG/ML IV BOLUS
INTRAVENOUS | Status: AC
  Filled 2016-10-13: qty 20

## 2016-10-13 MED ORDER — FENTANYL CITRATE (PF) 100 MCG/2ML IJ SOLN: 25.0000 ug | INTRAMUSCULAR | Status: DC | PRN

## 2016-10-13 MED ORDER — ROCURONIUM BROMIDE 10 MG/ML (PF) SYRINGE
PREFILLED_SYRINGE | INTRAVENOUS | Status: DC | PRN
  Administered 2016-10-13: 40 mg via INTRAVENOUS

## 2016-10-13 MED ORDER — ACETAMINOPHEN 325 MG PO TABS: 325.0000 mg | ORAL_TABLET | ORAL | Status: DC | PRN

## 2016-10-13 MED ORDER — SUGAMMADEX SODIUM 200 MG/2ML IV SOLN
INTRAVENOUS | Status: DC | PRN
  Administered 2016-10-13: 200 mg via INTRAVENOUS

## 2016-10-13 MED ORDER — DEXAMETHASONE SODIUM PHOSPHATE 10 MG/ML IJ SOLN
INTRAMUSCULAR | Status: AC
  Filled 2016-10-13: qty 1

## 2016-10-13 MED ORDER — KETOROLAC TROMETHAMINE 30 MG/ML IJ SOLN
INTRAMUSCULAR | Status: AC
  Filled 2016-10-13: qty 1

## 2016-10-13 MED ORDER — FENTANYL CITRATE (PF) 250 MCG/5ML IJ SOLN
INTRAMUSCULAR | Status: AC
  Filled 2016-10-13: qty 5

## 2016-10-13 MED ORDER — DEXAMETHASONE SODIUM PHOSPHATE 10 MG/ML IJ SOLN
INTRAMUSCULAR | Status: DC | PRN
  Administered 2016-10-13: 10 mg via INTRAVENOUS

## 2016-10-13 MED ORDER — HYDROCODONE-ACETAMINOPHEN 7.5-325 MG PO TABS: 1.0000 | ORAL_TABLET | Freq: Once | ORAL | Status: DC | PRN

## 2016-10-13 MED ORDER — BUPIVACAINE HCL (PF) 0.5 % IJ SOLN
INTRAMUSCULAR | Status: AC
  Filled 2016-10-13: qty 30

## 2016-10-13 MED ORDER — MEPERIDINE HCL 25 MG/ML IJ SOLN: 6.2500 mg | INTRAMUSCULAR | Status: DC | PRN

## 2016-10-13 MED ORDER — LACTATED RINGERS IV SOLN
INTRAVENOUS | Status: DC
  Administered 2016-10-13 (×2): via INTRAVENOUS

## 2016-10-13 MED ORDER — FENTANYL CITRATE (PF) 250 MCG/5ML IJ SOLN
INTRAMUSCULAR | Status: DC | PRN
  Administered 2016-10-13: 100 ug via INTRAVENOUS
  Administered 2016-10-13 (×3): 50 ug via INTRAVENOUS

## 2016-10-13 MED ORDER — ROCURONIUM BROMIDE 100 MG/10ML IV SOLN
INTRAVENOUS | Status: AC
  Filled 2016-10-13: qty 1

## 2016-10-13 MED ORDER — ONDANSETRON HCL 4 MG/2ML IJ SOLN
INTRAMUSCULAR | Status: DC | PRN
Start: 2016-10-13 — End: 2016-10-13
  Administered 2016-10-13: 4 mg via INTRAVENOUS

## 2016-10-13 MED ORDER — BUPIVACAINE HCL (PF) 0.5 % IJ SOLN
INTRAMUSCULAR | Status: DC | PRN
  Administered 2016-10-13: 23 mL

## 2016-10-13 MED ORDER — KETOROLAC TROMETHAMINE 30 MG/ML IJ SOLN: 30.0000 mg | Freq: Once | INTRAMUSCULAR | Status: AC | PRN

## 2016-10-13 SURGICAL SUPPLY — 33 items
CABLE HIGH FREQUENCY MONO STRZ (ELECTRODE) IMPLANT
CLOSURE WOUND 1/2 X4 (GAUZE/BANDAGES/DRESSINGS) ×1
CLOTH BEACON ORANGE TIMEOUT ST (SAFETY) ×3 IMPLANT
DRAPE ROBOTICS STRL (DRAPES) ×3 IMPLANT
DRSG OPSITE POSTOP 3X4 (GAUZE/BANDAGES/DRESSINGS) ×3 IMPLANT
DURAPREP 26ML APPLICATOR (WOUND CARE) ×3 IMPLANT
ELECT REM PT RETURN 9FT ADLT (ELECTROSURGICAL) ×3
ELECTRODE REM PT RTRN 9FT ADLT (ELECTROSURGICAL) ×1 IMPLANT
GLOVE BIO SURGEON STRL SZ 6.5 (GLOVE) ×4 IMPLANT
GLOVE BIO SURGEONS STRL SZ 6.5 (GLOVE) ×2
GLOVE BIOGEL PI IND STRL 7.0 (GLOVE) ×2 IMPLANT
GLOVE BIOGEL PI INDICATOR 7.0 (GLOVE) ×4
GOWN STRL REUS W/TWL LRG LVL3 (GOWN DISPOSABLE) ×9 IMPLANT
NDL SAFETY ECLIPSE 18X1.5 (NEEDLE) ×1 IMPLANT
NEEDLE HYPO 18GX1.5 SHARP (NEEDLE) ×2
NEEDLE INSUFFLATION 120MM (ENDOMECHANICALS) ×3 IMPLANT
PACK LAPAROSCOPY BASIN (CUSTOM PROCEDURE TRAY) ×3 IMPLANT
PACK TRENDGUARD 450 HYBRID PRO (MISCELLANEOUS) ×1 IMPLANT
POUCH SPECIMEN RETRIEVAL 10MM (ENDOMECHANICALS) ×3 IMPLANT
PROTECTOR NERVE ULNAR (MISCELLANEOUS) ×6 IMPLANT
SET IRRIG TUBING LAPAROSCOPIC (IRRIGATION / IRRIGATOR) ×3 IMPLANT
SHEARS HARMONIC ACE PLUS 36CM (ENDOMECHANICALS) ×3 IMPLANT
SLEEVE XCEL OPT CAN 5 100 (ENDOMECHANICALS) ×3 IMPLANT
SOLUTION ANTI FOG 6CC (MISCELLANEOUS) ×3 IMPLANT
STRIP CLOSURE SKIN 1/2X4 (GAUZE/BANDAGES/DRESSINGS) ×2 IMPLANT
SUT VICRYL 0 UR6 27IN ABS (SUTURE) ×3 IMPLANT
SUT VICRYL 4-0 PS2 18IN ABS (SUTURE) ×3 IMPLANT
SYSTEM CARTER THOMASON II (TROCAR) ×3 IMPLANT
TOWEL OR 17X24 6PK STRL BLUE (TOWEL DISPOSABLE) ×6 IMPLANT
TRENDGUARD 450 HYBRID PRO PACK (MISCELLANEOUS) ×3
TROCAR OPTI TIP 5M 100M (ENDOMECHANICALS) ×3 IMPLANT
TROCAR XCEL DIL TIP R 11M (ENDOMECHANICALS) ×3 IMPLANT
WARMER LAPAROSCOPE (MISCELLANEOUS) ×3 IMPLANT

## 2016-10-13 NOTE — Anesthesia Procedure Notes (Signed)
Procedure Name: Intubation Date/Time: 10/13/2016 9:07 AM Performed by: Talbot Grumbling Pre-anesthesia Checklist: Patient identified, Suction available and Patient being monitored Patient Re-evaluated:Patient Re-evaluated prior to inductionOxygen Delivery Method: Circle system utilized Preoxygenation: Pre-oxygenation with 100% oxygen Intubation Type: IV induction Ventilation: Mask ventilation without difficulty Laryngoscope Size: Miller and 2 Grade View: Grade I Tube type: Oral Tube size: 7.0 mm Number of attempts: 1 Airway Equipment and Method: Stylet Placement Confirmation: ETT inserted through vocal cords under direct vision,  CO2 detector and breath sounds checked- equal and bilateral Secured at: 21 cm Tube secured with: Tape Dental Injury: Teeth and Oropharynx as per pre-operative assessment

## 2016-10-13 NOTE — Discharge Instructions (Signed)
Diagnostic Laparoscopy, Care After  Refer to this sheet in the next few weeks. These instructions provide you with information about caring for yourself after your procedure. Your health care provider may also give you more specific instructions. Your treatment has been planned according to current medical practices, but problems sometimes occur. Call your health care provider if you have any problems or questions after your procedure.  What can I expect after the procedure?  After your procedure, it is common to have mild discomfort in the throat and abdomen.  Follow these instructions at home:  · Take over-the-counter and prescription medicines only as told by your health care provider.  · Do not drive for 24 hours if you received a sedative.  · Return to your normal activities as told by your health care provider.  · Do not take baths, swim, or use a hot tub until your health care provider approves. You may shower.  · Follow instructions from your health care provider about how to take care of your incision. Make sure you:  ? Wash your hands with soap and water before you change your bandage (dressing). If soap and water are not available, use hand sanitizer.  ? Change your dressing as told by your health care provider.  ? Leave stitches (sutures), skin glue, or adhesive strips in place. These skin closures may need to stay in place for 2 weeks or longer. If adhesive strip edges start to loosen and curl up, you may trim the loose edges. Do not remove adhesive strips completely unless your health care provider tells you to do that.  · Check your incision area every day for signs of infection. Check for:  ? More redness, swelling, or pain.  ? More fluid or blood.  ? Warmth.  ? Pus or a bad smell.  · It is your responsibility to get the results of your procedure. Ask your health care provider or the department performing the procedure when your results will be ready.  Contact a health care provider if:  · There is  new pain in your shoulders.  · You feel light-headed or faint.  · You are unable to pass gas or unable to have a bowel movement.  · You feel nauseous or you vomit.  · You develop a rash.  · You have more redness, swelling, or pain around your incision.  · You have more fluid or blood coming from your incision.  · Your incision feels warm to the touch.  · You have pus or a bad smell coming from your incision.  · You have a fever or chills.  Get help right away if:  · Your pain is getting worse.  · You have ongoing vomiting.  · The edges of your incision open up.  · You have trouble breathing.  · You have chest pain.  This information is not intended to replace advice given to you by your health care provider. Make sure you discuss any questions you have with your health care provider.  Document Released: 03/11/2015 Document Revised: 09/05/2015 Document Reviewed: 12/11/2014  Elsevier Interactive Patient Education © 2018 [employer]

## 2016-10-13 NOTE — H&P (Signed)
Adrienne Madden is an 31 y.o.  MW P0 here today referred from [facility] due to a CT finding of a 2.8 cm left ovarian dermoid. This was an incidental finding during her work up for kidney stones. She would like it removed.    Patient's last menstrual period was 09/11/2016 (approximate).    Past Medical History:  Diagnosis Date  . Chlamydia 2011  . Concussion 02/2016  . History of kidney stones   . IBS (irritable bowel syndrome)    no meds  . Obesity   . Polycystic ovarian syndrome     Past Surgical History:  Procedure Laterality Date  . EXTRACORPOREAL SHOCK WAVE LITHOTRIPSY Right 08/10/2016   Procedure: RIGHT EXTRACORPOREAL SHOCK WAVE LITHOTRIPSY (ESWL);  Surgeon: Nickie Retort, MD;  Location: [facility];  Service: Urology;  Laterality: Right;  . FINGER SURGERY Right    rt hand cyst  . LITHOTRIPSY  2015  . WISDOM TOOTH EXTRACTION      Family History  Problem Relation Age of Onset  . Diabetes Maternal Grandmother   . Diabetes Paternal Grandmother   . Cancer Paternal Grandmother        lung/bile duct  . Cancer Paternal Grandfather        bile duct    Social History:  reports that she has never smoked. She has never used smokeless tobacco. She reports that she does not drink alcohol or use drugs.  Allergies:  Allergies  Allergen Reactions  . Lactose Intolerance (Gi) Diarrhea  . Hydrocodone Nausea And Vomiting    Prescriptions Prior to Admission  Medication Sig Dispense Refill Last Dose  . drospirenone-ethinyl estradiol (YAZ) 3-0.02 MG tablet Take 1 tablet by mouth daily. (Patient not taking: Reported on 08/17/2016) 1 Package 11 Not Taking    ROS    Blood pressure 127/73, pulse 76, temperature 98.1 F (36.7 C), temperature source Oral, resp. rate 16, height 5\' 4"  (1.626 m), weight 101.6 kg (224 lb), last menstrual period 09/11/2016, SpO2 98 %. Physical Exam  Pleasant moderately obese WFNAD Breathing, conversing, and ambulating normally Heart- rrr Lungs-  CTAB Abd- benign, left side marked for surgery  No results found for this or any previous visit (from the past 24 hour(s)).  No results found.  Assessment/Plan: 2.8 cm left dermoid- plan for laparoscopic removal.  She understands the risks of surgery, including, but not to infection, bleeding, DVTs, damage to bowel, bladder, ureters. She wishes to proceed.     Song Garris C Kellin Bartling 10/13/2016, 8:30 AM

## 2016-10-13 NOTE — Anesthesia Preprocedure Evaluation (Signed)
Anesthesia Evaluation  Patient identified by MRN, date of birth, ID band Patient awake    Reviewed: Allergy & Precautions, NPO status , Patient's Chart, lab work & pertinent test results  Airway Mallampati: II       Dental no notable dental hx. (+) Teeth Intact   Pulmonary neg pulmonary ROS,    Pulmonary exam normal breath sounds clear to auscultation       Cardiovascular negative cardio ROS Normal cardiovascular exam Rhythm:Regular Rate:Normal     Neuro/Psych negative neurological ROS  negative psych ROS   GI/Hepatic negative GI ROS, Neg liver ROS,   Endo/Other  Morbid obesity  Renal/GU   negative genitourinary   Musculoskeletal negative musculoskeletal ROS (+)   Abdominal (+) + obese,   Peds  Hematology   Anesthesia Other Findings   Reproductive/Obstetrics negative OB ROS                             Anesthesia Physical Anesthesia Plan  ASA: III  Anesthesia Plan: General   Post-op Pain Management:    Induction: Intravenous  PONV Risk Score and Plan: 4 or greater and Ondansetron, Dexamethasone, Propofol, Midazolam and Scopolamine patch - Pre-op  Airway Management Planned: Oral ETT  Additional Equipment:   Intra-op Plan:   Post-operative Plan: Extubation in OR  Informed Consent: I have reviewed the patients History and Physical, chart, labs and discussed the procedure including the risks, benefits and alternatives for the proposed anesthesia with the patient or authorized representative who has indicated his/her understanding and acceptance.   Dental advisory given  Plan Discussed with: CRNA and Surgeon  Anesthesia Plan Comments:         Anesthesia Quick Evaluation

## 2016-10-13 NOTE — Transfer of Care (Signed)
Immediate Anesthesia Transfer of Care Note  Patient: Adrienne Madden  Procedure(s) Performed: Procedure(s): LAPAROSCOPIC OVARIAN CYSTECTOMY - LEFT DERMOID CYST (Left)  Patient Location: PACU  Anesthesia Type:General  Level of Consciousness:  sedated, patient cooperative and responds to stimulation  Airway & Oxygen Therapy:Patient Spontanous Breathing and Patient connected to face mask oxgen  Post-op Assessment:  Report given to PACU RN and Post -op Vital signs reviewed and stable  Post vital signs:  Reviewed and stable  Last Vitals:  Vitals:   10/13/16 0821  BP: 127/73  Pulse: 76  Resp: 16  Temp: 01.6 C    Complications: No apparent anesthesia complications

## 2016-10-13 NOTE — Op Note (Addendum)
10/13/2016  29:56 AM  PATIENT:  Adrienne Madden  31 y.o. female  PRE-OPERATIVE DIAGNOSIS:  LEFT DERMOID CYST  POST-OPERATIVE DIAGNOSIS:  LEFT DERMOID CYST  PROCEDURE:  Procedure(s): LAPAROSCOPIC OVARIAN CYSTECTOMY - LEFT DERMOID CYST (Left)  SURGEON:  Surgeon(s) and Role:    * Lennell Shanks, Wilhemina Cash, MD - Primary    * Lavonia Drafts, MD - Assisting   ANESTHESIA:   local and general  EBL:  Total I/O In: 1000 [I.V.:1000] Out: 10 [Blood:10]  BLOOD ADMINISTERED:none  DRAINS: none   LOCAL MEDICATIONS USED:  MARCAINE     SPECIMEN:  Source of Specimen:  left ovarian dermoid cyst  DISPOSITION OF SPECIMEN:  PATHOLOGY  COUNTS:  YES  TOURNIQUET:  * No tourniquets in log *  DICTATION: .Dragon Dictation  PLAN OF CARE: Discharge to home after PACU  PATIENT DISPOSITION:  PACU - hemodynamically stable.   Delay start of Pharmacological VTE agent (>24hrs) due to surgical blood loss or risk of bleeding: not applicable   The risks, benefits, alternatives of surgery were explained understood, accepted. All questions were answed. She was taken to the operating room and general anesthesia was applied without complication. She was placed in dorsal lithotomy position. Her abdomen and vagina were prepped and draped in the usual sterile fashion. A time out procedure was done. A bimanual exam revealed a normal, size and shape mobile uterus with nonenlarged adnexa. A Hulka manipulator was placed on the cervix. Gloves were changed and attention was turned to the abdomen. Approximately 10 mL of 0.5% Marcaine was used to infiltrate the subcutaneous tissue at the umbilicus. A vertical  57mmincision was made. A Veres needle was placed in the pelvis. Low-flow CO2 was used to insufflate the abdomen to approximately 3 L. After good pneumoperitoneum was established, a 5 mm trocar was placed. Laparoscopy confirmed correct placement. She was placed in the Trendelenburg position. A 5 mm port was placed under  direct laparoscopic visualization in the left lower quadrant and an 11 mm port in the right lower quadrant. Patient abdominal pressure was always less than 15. Her uterus,  oviducts, and liver appeared normal. Both ovaries had an appearance consistent with PCOS. There was no evidence of endometriosis. The left ovary was grasped and the cortex was scored with a Harmonic scalpel. The edges were grasped and the 2-3 cm dermoid cyst was removed intact. There was minimal bleeding. The cyst was removed in an Endopouch. Hemostasis was assured. The fascia of the 11 port site was closed using the Carter-Thompson device. No defect was palpable.  The CO2 was allowed to escape from her abdomen. The subcuticular closure was done with 4-0 Vicryl suture. The Hulka manipulator was removed. She was extubated and taken to recovery in stable condition. She tolerated the procedure well. The instrument, sponge, and needle counts were correct.

## 2016-10-15 ENCOUNTER — Encounter (HOSPITAL_COMMUNITY): Payer: Self-pay | Admitting: Obstetrics & Gynecology

## 2016-10-15 NOTE — Anesthesia Postprocedure Evaluation (Signed)
Anesthesia Post Note  Patient: Adrienne Madden  Procedure(s) Performed: Procedure(s) (LRB): LAPAROSCOPIC OVARIAN CYSTECTOMY - LEFT DERMOID CYST (Left)     Patient location during evaluation: PACU Anesthesia Type: General Level of consciousness: awake and sedated Pain management: pain level controlled Vital Signs Assessment: post-procedure vital signs reviewed and stable Respiratory status: spontaneous breathing Cardiovascular status: stable Postop Assessment: no signs of nausea or vomiting Anesthetic complications: no    Last Vitals:  Vitals:   10/13/16 1105 10/13/16 1210  BP: 122/74 (!) 135/57  Pulse: 76 66  Resp: 16 16  Temp:  37.1 C    Last Pain:  Vitals:   10/13/16 1030  TempSrc:   PainSc: 0-No pain   Pain Goal: Patients Stated Pain Goal: 3 (10/13/16 0821)               Lytle Malburg JR,JOHN Mateo Flow

## 2016-11-06 ENCOUNTER — Other Ambulatory Visit: Payer: Self-pay | Admitting: Obstetrics & Gynecology

## 2016-11-06 DIAGNOSIS — N926 Irregular menstruation, unspecified: Secondary | ICD-10-CM

## 2016-11-11 ENCOUNTER — Other Ambulatory Visit: Payer: Self-pay | Admitting: *Deleted

## 2016-11-11 DIAGNOSIS — N926 Irregular menstruation, unspecified: Secondary | ICD-10-CM

## 2016-11-11 MED ORDER — DROSPIRENONE-ETHINYL ESTRADIOL 3-0.02 MG PO TABS: 1.0000 | ORAL_TABLET | Freq: Every day | ORAL | 1 refills | Status: DC

## 2016-11-11 NOTE — Telephone Encounter (Signed)
Faxed received from [place] for [facility].  REf sent with 1 RF as pt is due for annual in Sept

## 2017-01-06 LAB — OB RESULTS CONSOLE GC/CHLAMYDIA
Chlamydia: NEGATIVE
GC PROBE AMP, GENITAL: NEGATIVE

## 2017-01-06 LAB — OB RESULTS CONSOLE HIV ANTIBODY (ROUTINE TESTING): HIV: NONREACTIVE

## 2017-01-06 LAB — OB RESULTS CONSOLE RPR: RPR: NONREACTIVE

## 2017-01-06 LAB — OB RESULTS CONSOLE HEPATITIS B SURFACE ANTIGEN: Hepatitis B Surface Ag: NEGATIVE

## 2017-01-06 LAB — OB RESULTS CONSOLE RUBELLA ANTIBODY, IGM: Rubella: IMMUNE

## 2017-04-13 NOTE — L&D Delivery Note (Signed)
Delivery Note At 12:58 PM a viable and healthy female was delivered via Vaginal, Spontaneous (Presentation: cepahlic; OA  ).  APGAR: 4,6 ; Placenta status: intact, spontaneous .  Cord: 3 vessel with no complications. Nuchal x1, loose 1 minute apgar of 4, evaluated by NICU. Patient developed spontaneous cry and improvement of apgar. Left with mother for skin to skin.  Anesthesia:  epidural Episiotomy:  none Lacerations:  Superficial vaginal tear Suture Repair: 3.0 vicryl Est. Blood Loss (mL):  150  Mom to postpartum.  Baby to Couplet care / Skin to Skin.  Guadalupe Dawn 07/22/2017, 1:18 PM

## 2017-06-04 ENCOUNTER — Encounter: Payer: Self-pay | Admitting: Advanced Practice Midwife

## 2017-06-04 ENCOUNTER — Ambulatory Visit (INDEPENDENT_AMBULATORY_CARE_PROVIDER_SITE_OTHER): Payer: Commercial Managed Care - PPO | Admitting: Advanced Practice Midwife

## 2017-06-04 DIAGNOSIS — Z23 Encounter for immunization: Secondary | ICD-10-CM | POA: Diagnosis not present

## 2017-06-04 DIAGNOSIS — Z3400 Encounter for supervision of normal first pregnancy, unspecified trimester: Secondary | ICD-10-CM | POA: Insufficient documentation

## 2017-06-04 DIAGNOSIS — Z3403 Encounter for supervision of normal first pregnancy, third trimester: Secondary | ICD-10-CM

## 2017-06-04 NOTE — Patient Instructions (Signed)
Considering Waterbirth? Guide for patients at [facility]  Why consider waterbirth?  . Gentle birth for babies . Less pain medicine used in labor . May allow for passive descent/less pushing . May reduce perineal tears  . More mobility and instinctive maternal position changes . Increased maternal relaxation . Reduced blood pressure in labor  Is waterbirth safe? What are the risks of infection, drowning or other complications?  . Infection: o Very low risk (3.7 % for tub vs 4.8% for bed) o 7 in 8000 waterbirths with documented infection o Poorly cleaned equipment most common cause o Slightly lower group B strep transmission rate  . Drowning o Maternal:  - Very low risk   - Related to seizures or fainting o Newborn:  - Very low risk. No evidence of increased risk of respiratory problems in multiple large studies - Physiological protection from breathing under water - Avoid underwater birth if there are any fetal complications - Once baby's head is out of the water, keep it out.  . Birth complication o Some reports of cord trauma, but risk decreased by bringing baby to surface gradually o No evidence of increased risk of shoulder dystocia. Mothers can usually change positions faster in water than in a bed, possibly aiding the maneuvers to free the shoulder.   You must attend a Waterbirth class at [facility]  3rd Wednesday of every month from 7-9pm  Free  Register by calling [phone] or online at [email or web address]  Bring us the certificate from the class to your prenatal appointment  Meet with a midwife at 36 weeks to see if you can still plan a waterbirth and to sign the consent.   Purchase or rent the following supplies:   Water Birth Pool (Birth Pool in a Box or [place] for instance)  (Tubs start ~$125)  Single-use disposable tub liner designed for your brand of tub  New garden hose labeled "lead-free", "suitable for drinking  water",  Electric drain pump to remove water (We recommend 792 gallon per hour or greater pump.)   Separate garden hose to remove the dirty water  Fish net  Bathing suit top (optional)  Long-handled mirror (optional)  Places to purchase or rent supplies  Yourwaterbirth.com for tub purchases and supplies  Waterbirthsolutions.com for tub purchases and supplies  [employer] (www.thelaborladies.com) $275 for tub rental/set-up & take down/kit   [place] [employer] (http://www.[facility].org/MeetUs.htm) Information regarding doulas (labor support) who provide pool rentals  Our practice has a Birth Pool in a Box tub at the hospital that you may borrow on a first-come-first-served basis. It is your responsibility to to set up, clean and break down the tub. We cannot guarantee the availability of this tub in advance. You are responsible for bringing all accessories listed above. If you do not have all necessary supplies you cannot have a waterbirth.    Things that would prevent you from having a waterbirth:  Premature, <37wks  Previous cesarean birth  Presence of thick meconium-stained fluid  Multiple gestation (Twins, triplets, etc.)  Uncontrolled diabetes or gestational diabetes requiring medication  Hypertension requiring medication or diagnosis of pre-eclampsia  Heavy vaginal bleeding  Non-reassuring fetal heart rate  Active infection (MRSA, etc.). Group B Strep is NOT a contraindication for  waterbirth.  If your labor has to be induced and induction method requires continuous  monitoring of the baby's heart rate  Other risks/issues identified by your obstetrical provider  Please remember that birth is unpredictable. Under certain unforeseeable   circumstances your provider may advise against giving birth in the tub. These decisions will be made on a case-by-case basis and with the safety of you and your baby as our highest priority.    

## 2017-06-06 NOTE — Progress Notes (Signed)
   PRENATAL VISIT NOTE  Subjective:  Adrienne Madden is a 32 y.o. G1P0000 at [redacted]w[redacted]d being seen today for NOB transfer from [facility] due to it closing. She is currently monitored for the following issues for this low-risk pregnancy and has Obesity; Nephrolithiasis; and Supervision of normal first pregnancy, antepartum on their problem list.  Patient reports no complaints.   . Vag. Bleeding: None.  Movement: Present. Denies leaking of fluid.   The following portions of the patient's history were reviewed and updated as appropriate: allergies, current medications, past family history, past medical history, past social history, past surgical history and problem list. Problem list updated.  Planning waterbirth. Has taken class.   Objective:   Vitals:   06/04/17 1045  BP: 107/71  Pulse: 90  Weight: 243 lb (110.2 kg)    Fetal Status: Fetal Heart Rate (bpm): 150 Fundal Height: 36 cm Movement: Present  Presentation: Vertex  General:  Alert, oriented and cooperative. Patient is in no acute distress.  Skin: Skin is warm and dry. No rash noted.   Cardiovascular: Normal heart rate noted  Respiratory: Normal respiratory effort, no problems with respiration noted  Abdomen: Soft, gravid, appropriate for gestational age.  Pain/Pressure: Present     Pelvic: Cervical exam deferred        Extremities: Normal range of motion.  Edema: Trace  Mental Status:  Normal mood and affect. Normal behavior. Normal judgment and thought content.   Assessment and Plan:  Pregnancy: G1P0000 at [redacted]w[redacted]d  1. Supervision of normal first pregnancy, antepartum - Records reviewed - Flu Vaccine QUAD 36+ mos IM (Fluarix, Quad PF) - Discussed R/B/contraindications of waterbirth. Handout given.  Preterm labor symptoms and general obstetric precautions including but not limited to vaginal bleeding, contractions, leaking of fluid and fetal movement were reviewed in detail with the patient. Please refer to After  Visit Summary for other counseling recommendations.  Discussed clinic routines, schedule of care and testing, genetic screening options, involvement of students and residents under the direct supervision of APPs and doctors and presence of female providers. Pt verbalized understanding. F/U 3 weeks.   Manya Silvas, CNM

## 2017-06-18 ENCOUNTER — Ambulatory Visit (INDEPENDENT_AMBULATORY_CARE_PROVIDER_SITE_OTHER): Payer: Commercial Managed Care - PPO

## 2017-06-18 VITALS — BP 111/74 | HR 104 | Wt 247.0 lb

## 2017-06-18 DIAGNOSIS — Z3400 Encounter for supervision of normal first pregnancy, unspecified trimester: Secondary | ICD-10-CM

## 2017-06-18 NOTE — Progress Notes (Signed)
   PRENATAL VISIT NOTE  Subjective:  Adrienne Madden is a 32 y.o. G1P0000 at [redacted]w[redacted]d being seen today for ongoing prenatal care.  She is currently monitored for the following issues for this low-risk pregnancy and has Obesity; Nephrolithiasis; and Supervision of normal first pregnancy, antepartum on their problem list.  Patient reports no complaints.  Contractions: Not present. Vag. Bleeding: None.  Movement: Present. Denies leaking of fluid.   The following portions of the patient's history were reviewed and updated as appropriate: allergies, current medications, past family history, past medical history, past social history, past surgical history and problem list. Problem list updated.  Objective:   Vitals:   06/18/17 0831  BP: 111/74  Pulse: (!) 104  Weight: 247 lb (112 kg)    Fetal Status: Fetal Heart Rate (bpm): 144 Fundal Height: 36 cm Movement: Present     General:  Alert, oriented and cooperative. Patient is in no acute distress.  Skin: Skin is warm and dry. No rash noted.   Cardiovascular: Normal heart rate noted  Respiratory: Normal respiratory effort, no problems with respiration noted  Abdomen: Soft, gravid, appropriate for gestational age.  Pain/Pressure: Absent     Pelvic: Cervical exam deferred        Extremities: Normal range of motion.  Edema: Trace  Mental Status:  Normal mood and affect. Normal behavior. Normal judgment and thought content.   Assessment and Plan:  Pregnancy: G1P0000 at [redacted]w[redacted]d  1. Supervision of normal first pregnancy, antepartum -No complaints. Routine care -GBS next visit. Explained testing and treatment -Waterbirth consent today  -Patient concerned about size of baby. Went to 3D ultrasound and was told baby was big. Explained measurement of FH and will watch closely. Consider growth u/s after next visit if needed.   Preterm labor symptoms and general obstetric precautions including but not limited to vaginal bleeding, contractions, leaking of  fluid and fetal movement were reviewed in detail with the patient. Please refer to After Visit Summary for other counseling recommendations.  Return in about 2 weeks (around 07/02/2017) for Return OB visit.   Wende Mott, CNM  06/18/17 8:53 AM

## 2017-06-18 NOTE — Patient Instructions (Signed)
Safe Medications in Pregnancy   Acne: Benzoyl Peroxide Salicylic Acid  Backache/Headache: Tylenol: 2 regular strength every 4 hours OR              2 Extra strength every 6 hours  Colds/Coughs/Allergies: Benadryl (alcohol free) 25 mg every 6 hours as needed Breath right strips Claritin Cepacol throat lozenges Chloraseptic throat spray Cold-Eeze- up to three times per day Cough drops, alcohol free Flonase (by prescription only) Guaifenesin Mucinex Robitussin DM (plain only, alcohol free) Saline nasal spray/drops Sudafed (pseudoephedrine) & Actifed ** use only after [redacted] weeks gestation and if you do not have high blood pressure Tylenol Vicks Vaporub Zinc lozenges Zyrtec   Constipation: Colace Ducolax suppositories Fleet enema Glycerin suppositories Metamucil Milk of magnesia Miralax Senokot Smooth move tea  Diarrhea: Kaopectate Imodium A-D  *NO pepto Bismol  Hemorrhoids: Anusol Anusol HC Preparation H Tucks  Indigestion: Tums Maalox Mylanta Zantac  Pepcid  Insomnia: Benadryl (alcohol free) 25mg  every 6 hours as needed Tylenol PM Unisom, no Gelcaps  Leg Cramps: Tums MagGel  Nausea/Vomiting:  Bonine Dramamine Emetrol Ginger extract Sea bands Meclizine  Nausea medication to take during pregnancy:  Unisom (doxylamine succinate 25 mg tablets) Take one tablet daily at bedtime. If symptoms are not adequately controlled, the dose can be increased to a maximum recommended dose of two tablets daily (1/2 tablet in the morning, 1/2 tablet mid-afternoon and one at bedtime). Vitamin B6 100mg  tablets. Take one tablet twice a day (up to 200 mg per day).  Skin Rashes: Aveeno products Benadryl cream or 25mg  every 6 hours as needed Calamine Lotion 1% cortisone cream  Yeast infection: Gyne-lotrimin 7 Monistat 7   **If taking multiple medications, please check labels to avoid duplicating the same active ingredients **take medication as directed on  the label ** Do not exceed 4000 mg of tylenol in 24 hours **Do not take medications that contain aspirin or ibuprofen  Fetal Movement Counts Patient Name: ________________________________________________ Patient Due Date: ____________________ What is a fetal movement count? A fetal movement count is the number of times that you feel your baby move during a certain amount of time. This may also be called a fetal kick count. A fetal movement count is recommended for every pregnant woman. You may be asked to start counting fetal movements as early as week 28 of your pregnancy. Pay attention to when your baby is most active. You may notice your baby's sleep and wake cycles. You may also notice things that make your baby move more. You should do a fetal movement count:  When your baby is normally most active.  At the same time each day.  A good time to count movements is while you are resting, after having something to eat and drink. How do I count fetal movements? 1. Find a quiet, comfortable area. Sit, or lie down on your side. 2. Write down the date, the start time and stop time, and the number of movements that you felt between those two times. Take this information with you to your health care visits. 3. For 2 hours, count kicks, flutters, swishes, rolls, and jabs. You should feel at least 10 movements during 2 hours. 4. You may stop counting after you have felt 10 movements. 5. If you do not feel 10 movements in 2 hours, have something to eat and drink. Then, keep resting and counting for 1 hour. If you feel at least 4 movements during that hour, you may stop counting. Contact a health care provider  if:  You feel fewer than 4 movements in 2 hours.  Your baby is not moving like he or she usually does. Date: ____________ Start time: ____________ Stop time: ____________ Movements: ____________ Date: ____________ Start time: ____________ Stop time: ____________ Movements: ____________ Date:  ____________ Start time: ____________ Stop time: ____________ Movements: ____________ Date: ____________ Start time: ____________ Stop time: ____________ Movements: ____________ Date: ____________ Start time: ____________ Stop time: ____________ Movements: ____________ Date: ____________ Start time: ____________ Stop time: ____________ Movements: ____________ Date: ____________ Start time: ____________ Stop time: ____________ Movements: ____________ Date: ____________ Start time: ____________ Stop time: ____________ Movements: ____________ Date: ____________ Start time: ____________ Stop time: ____________ Movements: ____________ This information is not intended to replace advice given to you by your health care provider. Make sure you discuss any questions you have with your health care provider. Document Released: 04/29/2006 Document Revised: 11/27/2015 Document Reviewed: 05/09/2015 Elsevier Interactive Patient Education  2018 [employer].  [employer] of the uterus can occur throughout pregnancy, but they are not always a sign that you are in labor. You may have practice contractions called Braxton Hicks contractions. These false labor contractions are sometimes confused with true labor. What are [place] contractions? Braxton Hicks contractions are tightening movements that occur in the muscles of the uterus before labor. Unlike true labor contractions, these contractions do not result in opening (dilation) and thinning of the cervix. Toward the end of pregnancy (32-34 weeks), Braxton Hicks contractions can happen more often and may become stronger. These contractions are sometimes difficult to tell apart from true labor because they can be very uncomfortable. You should not feel embarrassed if you go to the hospital with false labor. Sometimes, the only way to tell if you are in true labor is for your health care provider to look for changes in the cervix. The  health care provider will do a physical exam and may monitor your contractions. If you are not in true labor, the exam should show that your cervix is not dilating and your water has not broken. If there are other health problems associated with your pregnancy, it is completely safe for you to be sent home with false labor. You may continue to have Braxton Hicks contractions until you go into true labor. How to tell the difference between true labor and false labor True labor  Contractions last 30-70 seconds.  Contractions become very regular.  Discomfort is usually felt in the top of the uterus, and it spreads to the lower abdomen and low back.  Contractions do not go away with walking.  Contractions usually become more intense and increase in frequency.  The cervix dilates and gets thinner. False labor  Contractions are usually shorter and not as strong as true labor contractions.  Contractions are usually irregular.  Contractions are often felt in the front of the lower abdomen and in the groin.  Contractions may go away when you walk around or change positions while lying down.  Contractions get weaker and are shorter-lasting as time goes on.  The cervix usually does not dilate or become thin. Follow these instructions at home:  Take over-the-counter and prescription medicines only as told by your health care provider.  Keep up with your usual exercises and follow other instructions from your health care provider.  Eat and drink lightly if you think you are going into labor.  If Braxton Hicks contractions are making you uncomfortable: ? Change your position from lying down or resting to walking, or  change from walking to resting. ? Sit and rest in a tub of warm water. ? Drink enough fluid to keep your urine pale yellow. Dehydration may cause these contractions. ? Do slow and deep breathing several times an hour.  Keep all follow-up prenatal visits as told by your health  care provider. This is important. Contact a health care provider if:  You have a fever.  You have continuous pain in your abdomen. Get help right away if:  Your contractions become stronger, more regular, and closer together.  You have fluid leaking or gushing from your vagina.  You pass blood-tinged mucus (bloody show).  You have bleeding from your vagina.  You have low back pain that you never had before.  You feel your baby's head pushing down and causing pelvic pressure.  Your baby is not moving inside you as much as it used to. Summary  Contractions that occur before labor are called Braxton Hicks contractions, false labor, or practice contractions.  Braxton Hicks contractions are usually shorter, weaker, farther apart, and less regular than true labor contractions. True labor contractions usually become progressively stronger and regular and they become more frequent.  Manage discomfort from [facility] contractions by changing position, resting in a warm bath, drinking plenty of water, or practicing deep breathing. This information is not intended to replace advice given to you by your health care provider. Make sure you discuss any questions you have with your health care provider. Document Released: 08/13/2016 Document Revised: 08/13/2016 Document Reviewed: 08/13/2016 Elsevier Interactive Patient Education  2018 [employer].

## 2017-06-24 ENCOUNTER — Encounter (HOSPITAL_COMMUNITY): Payer: Self-pay | Admitting: *Deleted

## 2017-06-24 ENCOUNTER — Inpatient Hospital Stay (HOSPITAL_COMMUNITY)
Admission: AD | Admit: 2017-06-24 | Discharge: 2017-06-24 | Disposition: A | Discharge status: Home / Self Care | Payer: Commercial Managed Care - PPO | Source: Ambulatory Visit | Attending: Obstetrics and Gynecology | Admitting: Obstetrics and Gynecology

## 2017-06-24 DIAGNOSIS — Z885 Allergy status to narcotic agent status: Secondary | ICD-10-CM | POA: Diagnosis not present

## 2017-06-24 DIAGNOSIS — Z3A35 35 weeks gestation of pregnancy: Secondary | ICD-10-CM | POA: Diagnosis not present

## 2017-06-24 DIAGNOSIS — O368130 Decreased fetal movements, third trimester, not applicable or unspecified: Secondary | ICD-10-CM | POA: Diagnosis present

## 2017-06-24 NOTE — MAU Provider Note (Signed)
Chief Complaint:  Decreased Fetal Movement   First Provider Initiated Contact with Patient 82/95/62 1308     HPI: Adrienne Madden is a 32 y.o. G1P0000 at 104w0dwho presents to maternity admissions reporting decreased fetal movement.  Had a small amount of bleediing two days ago that has resolved.  Has irregular mild contractions. She denies LOF, vaginal bleeding, vaginal itching/burning, urinary symptoms, h/a, dizziness, n/v, diarrhea, constipation or fever/chills.  She denies headache, visual changes or RUQ abdominal pain.  Other  This is a new problem. The current episode started today. The problem has been resolved. Pertinent negatives include no abdominal pain, chills, fever, headaches or nausea. Nothing aggravates the symptoms. She has tried drinking, ice and position changes for the symptoms.    RN Note: Pt presents to MAU c/o decreased FM despite Fetal Kick Count measures. Pt reports she noticed some bleeding on Tuesday just when she wiped as well as she developed a cold that night. Pt reports having two fevers one Wednesday morning and one this morning. Pt reports tylenol controled the fever. Pt denies ctx or bleeding and LOF today. Pt reports some pelvic pressure.     Past Medical History: Past Medical History:  Diagnosis Date  . Chlamydia 2011  . Concussion 02/2016  . History of kidney stones   . IBS (irritable bowel syndrome)    no meds  . Obesity   . Polycystic ovarian syndrome     Past obstetric history: OB History  Gravida Para Term Preterm AB Living  1 0 0 0 0 0  SAB TAB Ectopic Multiple Live Births  0 0 0 0      # Outcome Date GA Lbr Len/2nd Weight Sex Delivery Anes PTL Lv  1 Current               Past Surgical History: Past Surgical History:  Procedure Laterality Date  . EXTRACORPOREAL SHOCK WAVE LITHOTRIPSY Right 08/10/2016   Procedure: RIGHT EXTRACORPOREAL SHOCK WAVE LITHOTRIPSY (ESWL);  Surgeon: Nickie Retort, MD;  Location: [facility];  Service:  Urology;  Laterality: Right;  . FINGER SURGERY Right    rt hand cyst  . LAPAROSCOPIC OVARIAN CYSTECTOMY Left 10/13/2016   Procedure: LAPAROSCOPIC OVARIAN CYSTECTOMY - LEFT DERMOID CYST;  Surgeon: Emily Filbert, MD;  Location: [place] [facility];  Service: Gynecology;  Laterality: Left;  . LITHOTRIPSY  2015  . WISDOM TOOTH EXTRACTION      Family History: Family History  Problem Relation Age of Onset  . Diabetes Maternal Grandmother   . Diabetes Paternal Grandmother   . Cancer Paternal Grandmother        lung/bile duct  . Cancer Paternal Grandfather        bile duct    Social History: Social History   Tobacco Use  . Smoking status: Never Smoker  . Smokeless tobacco: Never Used  Substance Use Topics  . Alcohol use: No    Alcohol/week: 0.5 oz    Types: 1 Standard drinks or equivalent per week  . Drug use: No    Allergies:  Allergies  Allergen Reactions  . Lactose Intolerance (Gi) Diarrhea  . Hydrocodone Nausea And Vomiting    Meds:  Medications Prior to Admission  Medication Sig Dispense Refill Last Dose  . acetaminophen (TYLENOL) 500 MG tablet Take 500 mg by mouth every 6 (six) hours as needed.   06/24/2017 at Unknown time  . Prenatal Multivit-Min-Fe-FA (PRENATAL VITAMINS PO) Take by mouth.   06/24/2017 at Unknown time    I  have reviewed patient's Past Medical Hx, Surgical Hx, Family Hx, Social Hx, medications and allergies.   ROS:  Review of Systems  Constitutional: Negative for chills and fever.  Gastrointestinal: Negative for abdominal pain and nausea.  Neurological: Negative for headaches.   Other systems negative  Physical Exam  No data found. Constitutional: Well-developed, well-nourished female in no acute distress.  Cardiovascular: normal rate and rhythm Respiratory: normal effort, clear to auscultation bilaterally GI: Abd soft, non-tender, gravid appropriate for gestational age.  MS: Extremities nontender, no edema, normal ROM Neurologic: Alert and oriented x 4.   GU: Neg CVAT.  PELVIC EXAM:  deferred  FHT:  Baseline 135 , moderate variability, accelerations present, no decelerations Contractions:  Irregular     Labs:   No results found for this or any previous visit (from the past 24 hour(s)).   Imaging:  No results found.  MAU Course/MDM: I have ordered labs and reviewed results.  NST reviewed and found to be reactive Treatments in MAU included NST.    Assessment: SIUP at [redacted]w[redacted]d Decreased fetal movement Reassuring nonstress test  Plan: Discharge home Labor precautions and fetal kick counts Follow up in Office for prenatal visits and recheck  Encouraged to return here or to other Urgent Care/ED if she develops worsening of symptoms, increase in pain, fever, or other concerning symptoms.   Pt stable at time of discharge.  Hansel Feinstein CNM, MSN Certified Nurse-Midwife 06/24/2017 9:55 PM

## 2017-06-24 NOTE — MAU Note (Signed)
Pt presents to MAU c/o decreased FM despite Fetal Kick Count measures. Pt reports she noticed some bleeding on Tuesday just when she wiped as well as she developed a cold that night. Pt reports having two fevers one Wednesday morning and one this morning. Pt reports tylenol controled the fever. Pt denies ctx or bleeding and LOF today. Pt reports some pelvic pressure.

## 2017-06-24 NOTE — Discharge Instructions (Signed)

## 2017-07-02 ENCOUNTER — Encounter: Payer: Self-pay | Admitting: Advanced Practice Midwife

## 2017-07-02 ENCOUNTER — Ambulatory Visit (INDEPENDENT_AMBULATORY_CARE_PROVIDER_SITE_OTHER): Payer: Commercial Managed Care - PPO | Admitting: Advanced Practice Midwife

## 2017-07-02 VITALS — BP 121/79 | HR 98 | Wt 249.0 lb

## 2017-07-02 DIAGNOSIS — Z113 Encounter for screening for infections with a predominantly sexual mode of transmission: Secondary | ICD-10-CM

## 2017-07-02 DIAGNOSIS — Z3400 Encounter for supervision of normal first pregnancy, unspecified trimester: Secondary | ICD-10-CM | POA: Diagnosis not present

## 2017-07-02 DIAGNOSIS — O368131 Decreased fetal movements, third trimester, fetus 1: Secondary | ICD-10-CM

## 2017-07-02 LAB — OB RESULTS CONSOLE GC/CHLAMYDIA: Gonorrhea: NEGATIVE

## 2017-07-02 LAB — OB RESULTS CONSOLE GBS: STREP GROUP B AG: NEGATIVE

## 2017-07-02 NOTE — Progress Notes (Incomplete)
   PRENATAL VISIT NOTE  Subjective:  Adrienne Madden is a 32 y.o. G1P0000 at [redacted]w[redacted]d being seen today for ongoing prenatal care.  She is currently monitored for the following issues for this {Blank single:19197::"high-risk","low-risk"} pregnancy and has Obesity; Nephrolithiasis; and Supervision of normal first pregnancy, antepartum on their problem list.  Patient reports {sx:14538}.  Contractions: Not present. Vag. Bleeding: None.  Movement: Present. Denies leaking of fluid.   The following portions of the patient's history were reviewed and updated as appropriate: allergies, current medications, past family history, past medical history, past social history, past surgical history and problem list. Problem list updated.  Objective:   Vitals:   07/02/17 0828  BP: 121/79  Pulse: 98  Weight: 249 lb (112.9 kg)    Fetal Status: Fetal Heart Rate (bpm): 147 Fundal Height: 37 cm Movement: Present  Presentation: Vertex  General:  Alert, oriented and cooperative. Patient is in no acute distress.  Skin: Skin is warm and dry. No rash noted.   Cardiovascular: Normal heart rate noted  Respiratory: Normal respiratory effort, no problems with respiration noted  Abdomen: Soft, gravid, appropriate for gestational age.  Pain/Pressure: Present     Pelvic: {Blank single:19197::"Cervical exam performed","Cervical exam deferred"} Dilation: 1 Effacement (%): 0 Station: Ballotable  Extremities: Normal range of motion.  Edema: Mild pitting, slight indentation  Mental Status:  Normal mood and affect. Normal behavior. Normal judgment and thought content.   Assessment and Plan:  Pregnancy: G1P0000 at [redacted]w[redacted]d  1. Supervision of normal first pregnancy, antepartum *** - Culture, beta strep (group b only) - Urine cytology ancillary only  {Blank single:19197::"Term","Preterm"} labor symptoms and general obstetric precautions including but not limited to vaginal bleeding, contractions, leaking of fluid and fetal  movement were reviewed in detail with the patient. Please refer to After Visit Summary for other counseling recommendations.  Return in about 1 week (around 07/09/2017) for [place].   Manya Silvas, CNM

## 2017-07-02 NOTE — Progress Notes (Signed)
   PRENATAL VISIT NOTE  Subjective:  Adrienne Madden is a 32 y.o. G1P0000 at [redacted]w[redacted]d being seen today for ongoing prenatal care.  She is currently monitored for the following issues for this low-risk pregnancy and has Obesity; Nephrolithiasis; and Supervision of normal first pregnancy, antepartum on their problem list.  Patient reports decreased fetal mvmt x 1 week, pelvic pressure and swelling of feet at work that resolves.  Denies HA, vision changes or epigastric pain.  Contractions: Not present. Vag. Bleeding: None.  Movement: (!) Decreased. Denies leaking of fluid. Was seen in MAU 1 week ago for decreased FM. NST reactive.   The following portions of the patient's history were reviewed and updated as appropriate: allergies, current medications, past family history, past medical history, past social history, past surgical history and problem list. Problem list updated.  Objective:   Vitals:   07/02/17 0828  BP: 121/79  Pulse: 98  Weight: 249 lb (112.9 kg)    Fetal Status: Fetal Heart Rate (bpm): 147 Fundal Height: 37 cm Movement: (!) Decreased  Presentation: Vertex  NST Baseline: 145 bpm, Variability: Good {> 6 bpm), Accelerations: Reactive and Decelerations: Absent Irregular, mild  General:  Alert, oriented and cooperative. Patient is in no acute distress.  Skin: Skin is warm and dry. No rash noted.   Cardiovascular: Normal heart rate noted  Respiratory: Normal respiratory effort, no problems with respiration noted  Abdomen: Soft, gravid, appropriate for gestational age.  Pain/Pressure: Present     Pelvic: Cervical exam performed Dilation: 1 Effacement (%): 0 Station: Ballotable  Extremities: Normal range of motion.  Edema: Mild pitting, slight indentation  Mental Status:  Normal mood and affect. Normal behavior. Normal judgment and thought content.   Assessment and Plan:  Pregnancy: G1P0000 at [redacted]w[redacted]d  1. Supervision of normal first pregnancy, antepartum  - Culture, beta strep  (group b only) - Urine cytology ancillary only   2. Decreased fetal movement, third trimester, fetus 1  - NST Reactive - FKCs   Term labor symptoms and general obstetric precautions including but not limited to vaginal bleeding, contractions, leaking of fluid and fetal movement were reviewed in detail with the patient. Please refer to After Visit Summary for other counseling recommendations.  Return in about 1 week (around 07/09/2017) for [place].   Manya Silvas, CNM

## 2017-07-02 NOTE — Patient Instructions (Signed)

## 2017-07-05 LAB — CULTURE, BETA STREP (GROUP B ONLY)
MICRO NUMBER: 90362916
SPECIMEN QUALITY: ADEQUATE

## 2017-07-05 LAB — URINE CYTOLOGY ANCILLARY ONLY
CHLAMYDIA, DNA PROBE: NEGATIVE
Neisseria Gonorrhea: NEGATIVE

## 2017-07-09 ENCOUNTER — Ambulatory Visit (INDEPENDENT_AMBULATORY_CARE_PROVIDER_SITE_OTHER): Payer: Commercial Managed Care - PPO | Admitting: Certified Nurse Midwife

## 2017-07-09 VITALS — BP 110/80 | HR 92 | Wt 255.0 lb

## 2017-07-09 DIAGNOSIS — Z3400 Encounter for supervision of normal first pregnancy, unspecified trimester: Secondary | ICD-10-CM

## 2017-07-09 NOTE — Progress Notes (Signed)
Subjective:  Adrienne Madden is a 32 y.o. G1P0000 at [redacted]w[redacted]d being seen today for ongoing prenatal care.  She is currently monitored for the following issues for this low-risk pregnancy and has Obesity; Nephrolithiasis; and Supervision of normal first pregnancy, antepartum on their problem list.  Patient reports no complaints.  Contractions: Not present. Vag. Bleeding: None.  Movement: Present. Denies leaking of fluid.   The following portions of the patient's history were reviewed and updated as appropriate: allergies, current medications, past family history, past medical history, past social history, past surgical history and problem list. Problem list updated.  Objective:   Vitals:   07/09/17 0912  BP: 110/80  Pulse: 92  Weight: 255 lb (115.7 kg)    Fetal Status: Fetal Heart Rate (bpm): 147 Fundal Height: 38 cm Movement: Present  Presentation: Vertex  General:  Alert, oriented and cooperative. Patient is in no acute distress.  Skin: Skin is warm and dry. No rash noted.   Cardiovascular: Normal heart rate noted  Respiratory: Normal respiratory effort, no problems with respiration noted  Abdomen: Soft, gravid, appropriate for gestational age. Pain/Pressure: Present     Pelvic: Vag. Bleeding: None Vag D/C Character: Thin   Cervical exam deferred        Extremities: Normal range of motion.  Edema: Trace  Mental Status: Normal mood and affect. Normal behavior. Normal judgment and thought content.   Urinalysis: Urine Protein: Negative Urine Glucose: Negative  Assessment and Plan:  Pregnancy: G1P0000 at [redacted]w[redacted]d  1. Supervision of normal first pregnancy  Term labor symptoms and general obstetric precautions including but not limited to vaginal bleeding, contractions, leaking of fluid and fetal movement were reviewed in detail with the patient. Please refer to After Visit Summary for other counseling recommendations.  Return in about 1 week (around 07/16/2017).   Julianne Handler,  CNM

## 2017-07-09 NOTE — Patient Instructions (Signed)
Braxton Hicks Contractions °Contractions of the uterus can occur throughout pregnancy, but they are not always a sign that you are in labor. You may have practice contractions called Braxton Hicks contractions. These false labor contractions are sometimes confused with true labor. °What are Braxton Hicks contractions? °Braxton Hicks contractions are tightening movements that occur in the muscles of the uterus before labor. Unlike true labor contractions, these contractions do not result in opening (dilation) and thinning of the cervix. Toward the end of pregnancy (32-34 weeks), Braxton Hicks contractions can happen more often and may become stronger. These contractions are sometimes difficult to tell apart from true labor because they can be very uncomfortable. You should not feel embarrassed if you go to the hospital with false labor. °Sometimes, the only way to tell if you are in true labor is for your health care provider to look for changes in the cervix. The health care provider will do a physical exam and may monitor your contractions. If you are not in true labor, the exam should show that your cervix is not dilating and your water has not broken. °If there are other health problems associated with your pregnancy, it is completely safe for you to be sent home with false labor. You may continue to have Braxton Hicks contractions until you go into true labor. °How to tell the difference between true labor and false labor °True labor °· Contractions last 30-70 seconds. °· Contractions become very regular. °· Discomfort is usually felt in the top of the uterus, and it spreads to the lower abdomen and low back. °· Contractions do not go away with walking. °· Contractions usually become more intense and increase in frequency. °· The cervix dilates and gets thinner. °False labor °· Contractions are usually shorter and not as strong as true labor contractions. °· Contractions are usually irregular. °· Contractions  are often felt in the front of the lower abdomen and in the groin. °· Contractions may go away when you walk around or change positions while lying down. °· Contractions get weaker and are shorter-lasting as time goes on. °· The cervix usually does not dilate or become thin. °Follow these instructions at home: °· Take over-the-counter and prescription medicines only as told by your health care provider. °· Keep up with your usual exercises and follow other instructions from your health care provider. °· Eat and drink lightly if you think you are going into labor. °· If Braxton Hicks contractions are making you uncomfortable: °? Change your position from lying down or resting to walking, or change from walking to resting. °? Sit and rest in a tub of warm water. °? Drink enough fluid to keep your urine pale yellow. Dehydration may cause these contractions. °? Do slow and deep breathing several times an hour. °· Keep all follow-up prenatal visits as told by your health care provider. This is important. °Contact a health care provider if: °· You have a fever. °· You have continuous pain in your abdomen. °Get help right away if: °· Your contractions become stronger, more regular, and closer together. °· You have fluid leaking or gushing from your vagina. °· You pass blood-tinged mucus (bloody show). °· You have bleeding from your vagina. °· You have low back pain that you never had before. °· You feel your baby’s head pushing down and causing pelvic pressure. °· Your baby is not moving inside you as much as it used to. °Summary °· Contractions that occur before labor are called Braxton   Hicks contractions, false labor, or practice contractions. °· Braxton Hicks contractions are usually shorter, weaker, farther apart, and less regular than true labor contractions. True labor contractions usually become progressively stronger and regular and they become more frequent. °· Manage discomfort from Braxton Hicks contractions by  changing position, resting in a warm bath, drinking plenty of water, or practicing deep breathing. °This information is not intended to replace advice given to you by your health care provider. Make sure you discuss any questions you have with your health care provider. °Document Released: 08/13/2016 Document Revised: 08/13/2016 Document Reviewed: 08/13/2016 °Elsevier Interactive Patient Education © 2018 [employer] ° °

## 2017-07-16 ENCOUNTER — Ambulatory Visit (INDEPENDENT_AMBULATORY_CARE_PROVIDER_SITE_OTHER): Payer: Commercial Managed Care - PPO | Admitting: Advanced Practice Midwife

## 2017-07-16 ENCOUNTER — Encounter: Payer: Self-pay | Admitting: Advanced Practice Midwife

## 2017-07-16 VITALS — BP 128/79 | HR 101 | Wt 258.0 lb

## 2017-07-16 DIAGNOSIS — Z3403 Encounter for supervision of normal first pregnancy, third trimester: Secondary | ICD-10-CM

## 2017-07-16 DIAGNOSIS — Z3400 Encounter for supervision of normal first pregnancy, unspecified trimester: Secondary | ICD-10-CM

## 2017-07-16 NOTE — Progress Notes (Signed)
PT states she had a BP reading at home that was 138/89. Pt states she has had a slight headache that goes away after a nap. No visual changes.

## 2017-07-16 NOTE — Progress Notes (Incomplete)
PRENATAL VISIT NOTE  Subjective:  Adrienne Madden is a 32 y.o. G1P0000 at [redacted]w[redacted]d being seen today for ongoing prenatal care.  She is currently monitored for the following issues for this {Blank single:19197::"high-risk","low-risk"} pregnancy and has Obesity; Nephrolithiasis; and Supervision of normal first pregnancy, antepartum on their problem list.  Patient reports {sx:14538}.  Contractions: Irritability. Vag. Bleeding: None.  Movement: Present. Denies leaking of fluid.   The following portions of the patient's history were reviewed and updated as appropriate: allergies, current medications, past family history, past medical history, past social history, past surgical history and problem list. Problem list updated.  Objective:   Vitals:   07/16/17 0816  BP: 128/79  Pulse: (!) 101  Weight: 258 lb (117 kg)    Fetal Status: Fetal Heart Rate (bpm): 137 Fundal Height: 39 cm Movement: Present  Presentation: Vertex  General:  Alert, oriented and cooperative. Patient is in no acute distress.  Skin: Skin is warm and dry. No rash noted.   Cardiovascular: Normal heart rate noted  Respiratory: Normal respiratory effort, no problems with respiration noted  Abdomen: Soft, gravid, appropriate for gestational age.  Pain/Pressure: Present     Pelvic: {Blank single:19197::"Cervical exam performed","Cervical exam deferred"} Dilation: 3.5 Effacement (%): 60 Station: -3  Extremities: Normal range of motion.  Edema: Mild pitting, slight indentation  Mental Status: Normal mood and affect. Normal behavior. Normal judgment and thought content.   Assessment and Plan:  Pregnancy: G1P0000 at [redacted]w[redacted]d  1. Supervision of normal first pregnancy, antepartum ***  {Blank single:19197::"Term","Preterm"} labor symptoms and general obstetric precautions including but not limited to vaginal bleeding, contractions, leaking of fluid and fetal movement were reviewed in detail with the patient. Please refer to After  Visit Summary for other counseling recommendations.  Return in about 1 week (around 07/23/2017) for ROB.  No future appointments.  Manya Silvas, CNM  PRENATAL VISIT NOTE  Subjective:  Adrienne Madden is a 32 y.o. G1P0000 at [redacted]w[redacted]d being seen today for ongoing prenatal care.  She is currently monitored for the following issues for this {Blank single:19197::"high-risk","low-risk"} pregnancy and has Obesity; Nephrolithiasis; and Supervision of normal first pregnancy, antepartum on their problem list.  Patient reports {sx:[id number]}.  Contractions: Irritability. Vag. Bleeding: None.  Movement: Present. Denies leaking of fluid.   The following portions of the patient's history were reviewed and updated as appropriate: allergies, current medications, past family history, past medical history, past social history, past surgical history and problem list. Problem list updated.  Objective:   Vitals:   07/16/17 0816  BP: 128/79  Pulse: (!) 101  Weight: 258 lb (117 kg)    Fetal Status: Fetal Heart Rate (bpm): 137 Fundal Height: 39 cm Movement: Present  Presentation: Vertex  General:  Alert, oriented and cooperative. Patient is in no acute distress.  Skin: Skin is warm and dry. No rash noted.   Cardiovascular: Normal heart rate noted  Respiratory: Normal respiratory effort, no problems with respiration noted  Abdomen: Soft, gravid, appropriate for gestational age.  Pain/Pressure: Present     Pelvic: {Blank single:19197::"Cervical exam performed","Cervical exam deferred"} Dilation: 3.5 Effacement (%): 60 Station: -3  Extremities: Normal range of motion.  Edema: Mild pitting, slight indentation  Mental Status: Normal mood and affect. Normal behavior. Normal judgment and thought content.   Assessment and Plan:  Pregnancy: G1P0000 at [redacted]w[redacted]d  1. Supervision of normal first pregnancy, antepartum ***  {Blank single:19197::"Term","Preterm"} labor symptoms and general obstetric precautions including  but not limited to vaginal bleeding, contractions, leaking of fluid  and fetal movement were reviewed in detail with the patient. Please refer to After Visit Summary for other counseling recommendations.  Return in about 1 week (around 07/23/2017) for ROB.  No future appointments.  Manya Silvas, CNM  PRENATAL VISIT NOTE  Subjective:  Adrienne Madden is a 32 y.o. G1P0000 at [redacted]w[redacted]d being seen today for ongoing prenatal care.  She is currently monitored for the following issues for this {Blank single:19197::"high-risk","low-risk"} pregnancy and has Obesity; Nephrolithiasis; and Supervision of normal first pregnancy, antepartum on their problem list.  Patient reports {sx:[id number]}.  Contractions: Irritability. Vag. Bleeding: None.  Movement: Present. Denies leaking of fluid.   The following portions of the patient's history were reviewed and updated as appropriate: allergies, current medications, past family history, past medical history, past social history, past surgical history and problem list. Problem list updated.  Objective:   Vitals:   07/16/17 0816  BP: 128/79  Pulse: (!) 101  Weight: 258 lb (117 kg)    Fetal Status: Fetal Heart Rate (bpm): 137 Fundal Height: 39 cm Movement: Present  Presentation: Vertex  General:  Alert, oriented and cooperative. Patient is in no acute distress.  Skin: Skin is warm and dry. No rash noted.   Cardiovascular: Normal heart rate noted  Respiratory: Normal respiratory effort, no problems with respiration noted  Abdomen: Soft, gravid, appropriate for gestational age.  Pain/Pressure: Present     Pelvic: {Blank single:19197::"Cervical exam performed","Cervical exam deferred"} Dilation: 3.5 Effacement (%): 60 Station: -3  Extremities: Normal range of motion.  Edema: Mild pitting, slight indentation  Mental Status: Normal mood and affect. Normal behavior. Normal judgment and thought content.   Assessment and Plan:  Pregnancy: G1P0000 at [redacted]w[redacted]d  1.  Supervision of normal first pregnancy, antepartum ***  {Blank single:19197::"Term","Preterm"} labor symptoms and general obstetric precautions including but not limited to vaginal bleeding, contractions, leaking of fluid and fetal movement were reviewed in detail with the patient. Please refer to After Visit Summary for other counseling recommendations.  Return in about 1 week (around 07/23/2017) for ROB.  No future appointments.  Manya Silvas, CNM

## 2017-07-16 NOTE — Progress Notes (Signed)
   PRENATAL VISIT NOTE  Subjective:  Adrienne Madden is a 32 y.o. G1P0000 at [redacted]w[redacted]d being seen today for ongoing prenatal care.  She is currently monitored for the following issues for this low-risk pregnancy and has Obesity; Nephrolithiasis; and Supervision of normal first pregnancy, antepartum on their problem list.  Patient reports headache that resolved w/ nap. Denies vision changes or epigastric pain..  Contractions: Irritability. Vag. Bleeding: None.  Movement: Present. Denies leaking of fluid.   The following portions of the patient's history were reviewed and updated as appropriate: allergies, current medications, past family history, past medical history, past social history, past surgical history and problem list. Problem list updated.  Objective:   Vitals:   07/16/17 0816  BP: 128/79  Pulse: (!) 101  Weight: 258 lb (117 kg)    Fetal Status: Fetal Heart Rate (bpm): 137 Fundal Height: 39 cm Movement: Present  Presentation: Vertex  General:  Alert, oriented and cooperative. Patient is in no acute distress.  Skin: Skin is warm and dry. No rash noted.   Cardiovascular: Normal heart rate noted  Respiratory: Normal respiratory effort, no problems with respiration noted  Abdomen: Soft, gravid, appropriate for gestational age.  Pain/Pressure: Present     Pelvic: Cervical exam performed Dilation: 3.5 Effacement (%): 60 Station: -3  Extremities: Normal range of motion.  Edema: Mild pitting, slight indentation  Mental Status: Normal mood and affect. Normal behavior. Normal judgment and thought content.   Assessment and Plan:  Pregnancy: G1P0000 at [redacted]w[redacted]d  1. Supervision of normal first pregnancy, antepartum  Borderline BP at home. Nml here. Pre-E precautions reviewed.   Term labor symptoms and general obstetric precautions including but not limited to vaginal bleeding, contractions, leaking of fluid and fetal movement were reviewed in detail with the patient. Please refer to After  Visit Summary for other counseling recommendations.  Return in about 1 week (around 07/23/2017) for ROB.    Manya Silvas, CNM

## 2017-07-16 NOTE — Patient Instructions (Addendum)
Braxton Hicks Contractions Contractions of the uterus can occur throughout pregnancy, but they are not always a sign that you are in labor. You may have practice contractions called Braxton Hicks contractions. These false labor contractions are sometimes confused with true labor. What are [place] contractions? Braxton Hicks contractions are tightening movements that occur in the muscles of the uterus before labor. Unlike true labor contractions, these contractions do not result in opening (dilation) and thinning of the cervix. Toward the end of pregnancy (32-34 weeks), Braxton Hicks contractions can happen more often and may become stronger. These contractions are sometimes difficult to tell apart from true labor because they can be very uncomfortable. You should not feel embarrassed if you go to the hospital with false labor. Sometimes, the only way to tell if you are in true labor is for your health care provider to look for changes in the cervix. The health care provider will do a physical exam and may monitor your contractions. If you are not in true labor, the exam should show that your cervix is not dilating and your water has not broken. If there are other health problems associated with your pregnancy, it is completely safe for you to be sent home with false labor. You may continue to have Braxton Hicks contractions until you go into true labor. How to tell the difference between true labor and false labor True labor  Contractions last 30-70 seconds.  Contractions become very regular.  Discomfort is usually felt in the top of the uterus, and it spreads to the lower abdomen and low back.  Contractions do not go away with walking.  Contractions usually become more intense and increase in frequency.  The cervix dilates and gets thinner. False labor  Contractions are usually shorter and not as strong as true labor contractions.  Contractions are usually irregular.  Contractions  are often felt in the front of the lower abdomen and in the groin.  Contractions may go away when you walk around or change positions while lying down.  Contractions get weaker and are shorter-lasting as time goes on.  The cervix usually does not dilate or become thin. Follow these instructions at home:  Take over-the-counter and prescription medicines only as told by your health care provider.  Keep up with your usual exercises and follow other instructions from your health care provider.  Eat and drink lightly if you think you are going into labor.  If Braxton Hicks contractions are making you uncomfortable: ? Change your position from lying down or resting to walking, or change from walking to resting. ? Sit and rest in a tub of warm water. ? Drink enough fluid to keep your urine pale yellow. Dehydration may cause these contractions. ? Do slow and deep breathing several times an hour.  Keep all follow-up prenatal visits as told by your health care provider. This is important. Contact a health care provider if:  You have a fever.  You have continuous pain in your abdomen. Get help right away if:  Your contractions become stronger, more regular, and closer together.  You have fluid leaking or gushing from your vagina.  You pass blood-tinged mucus (bloody show).  You have bleeding from your vagina.  You have low back pain that you never had before.  You feel your baby's head pushing down and causing pelvic pressure.  Your baby is not moving inside you as much as it used to. Summary  Contractions that occur before labor are called Braxton  Hicks contractions, false labor, or practice contractions.  Braxton Hicks contractions are usually shorter, weaker, farther apart, and less regular than true labor contractions. True labor contractions usually become progressively stronger and regular and they become more frequent.  Manage discomfort from [facility] contractions by  changing position, resting in a warm bath, drinking plenty of water, or practicing deep breathing. This information is not intended to replace advice given to you by your health care provider. Make sure you discuss any questions you have with your health care provider. Document Released: 08/13/2016 Document Revised: 08/13/2016 Document Reviewed: 08/13/2016 Elsevier Interactive Patient Education  2018 [employer].     Preeclampsia and Eclampsia Preeclampsia is a serious condition that develops only during pregnancy. It is also called toxemia of pregnancy. This condition causes high blood pressure along with other symptoms, such as swelling and headaches. These symptoms may develop as the condition gets worse. Preeclampsia may occur at 20 weeks of pregnancy or later. Diagnosing and treating preeclampsia early is very important. If not treated early, it can cause serious problems for you and your baby. One problem it can lead to is eclampsia, which is a condition that causes muscle jerking or shaking (convulsions or seizures) in the mother. Delivering your baby is the best treatment for preeclampsia or eclampsia. Preeclampsia and eclampsia symptoms usually go away after your baby is born. What are the causes? The cause of preeclampsia is not known. What increases the risk? The following risk factors make you more likely to develop preeclampsia:  Being pregnant for the first time.  Having had preeclampsia during a past pregnancy.  Having a family history of preeclampsia.  Having high blood pressure.  Being pregnant with twins or triplets.  Being 57 or older.  Being African-American.  Having kidney disease or diabetes.  Having medical conditions such as lupus or blood diseases.  Being very overweight (obese).  What are the signs or symptoms? The earliest signs of preeclampsia are:  High blood pressure.  Increased protein in your urine. Your health care provider will check for  this at every visit before you give birth (prenatal visit).  Other symptoms that may develop as the condition gets worse include:  Severe headaches.  Sudden weight gain.  Swelling of the hands, face, legs, and feet.  Nausea and vomiting.  Vision problems, such as blurred or double vision.  Numbness in the face, arms, legs, and feet.  Urinating less than usual.  Dizziness.  Slurred speech.  Abdominal pain, especially upper abdominal pain.  Convulsions or seizures.  Symptoms generally go away after giving birth. How is this diagnosed? There are no screening tests for preeclampsia. Your health care provider will ask you about symptoms and check for signs of preeclampsia during your prenatal visits. You may also have tests that include:  Urine tests.  Blood tests.  Checking your blood pressure.  Monitoring your baby's heart rate.  Ultrasound.  How is this treated? You and your health care provider will determine the treatment approach that is best for you. Treatment may include:  Having more frequent prenatal exams to check for signs of preeclampsia, if you have an increased risk for preeclampsia.  Bed rest.  Reducing how much salt (sodium) you eat.  Medicine to lower your blood pressure.  Staying in the hospital, if your condition is severe. There, treatment will focus on controlling your blood pressure and the amount of fluids in your body (fluid retention).  You may need to take medicine (magnesium sulfate)  to prevent seizures. This medicine may be given as an injection or through an IV tube.  Delivering your baby early, if your condition gets worse. You may have your labor started with medicine (induced), or you may have a cesarean delivery.  Follow these instructions at home: Eating and drinking   Drink enough fluid to keep your urine clear or pale yellow.  Eat a healthy diet that is low in sodium. Do not add salt to your food. Check nutrition labels to  see how much sodium a food or beverage contains.  Avoid caffeine. Lifestyle  Do not use any products that contain nicotine or tobacco, such as cigarettes and e-cigarettes. If you need help quitting, ask your health care provider.  Do not use alcohol or drugs.  Avoid stress as much as possible. Rest and get plenty of sleep. General instructions  Take over-the-counter and prescription medicines only as told by your health care provider.  When lying down, lie on your side. This keeps pressure off of your baby.  When sitting or lying down, raise (elevate) your feet. Try putting some pillows underneath your lower legs.  Exercise regularly. Ask your health care provider what kinds of exercise are best for you.  Keep all follow-up and prenatal visits as told by your health care provider. This is important. How is this prevented? To prevent preeclampsia or eclampsia from developing during another pregnancy:  Get proper medical care during pregnancy. Your health care provider may be able to prevent preeclampsia or diagnose and treat it early.  Your health care provider may have you take a low-dose aspirin or a calcium supplement during your next pregnancy.  You may have tests of your blood pressure and kidney function after giving birth.  Maintain a healthy weight. Ask your health care provider for help managing weight gain during pregnancy.  Work with your health care provider to manage any long-term (chronic) health conditions you have, such as diabetes or kidney problems.  Contact a health care provider if:  You gain more weight than expected.  You have headaches.  You have nausea or vomiting.  You have abdominal pain.  You feel dizzy or light-headed. Get help right away if:  You develop sudden or severe swelling anywhere in your body. This usually happens in the legs.  You gain 5 lbs (2.3 kg) or more during one week.  You have severe: ? Abdominal  pain. ? Headaches. ? Dizziness. ? Vision problems. ? Confusion. ? Nausea or vomiting.  You have a seizure.  You have trouble moving any part of your body.  You develop numbness in any part of your body.  You have trouble speaking.  You have any abnormal bleeding.  You pass out. This information is not intended to replace advice given to you by your health care provider. Make sure you discuss any questions you have with your health care provider. Document Released: 03/27/2000 Document Revised: 11/26/2015 Document Reviewed: 11/04/2015 Elsevier Interactive Patient Education  [employer].

## 2017-07-21 ENCOUNTER — Encounter: Payer: Commercial Managed Care - PPO | Admitting: Obstetrics and Gynecology

## 2017-07-21 ENCOUNTER — Inpatient Hospital Stay (HOSPITAL_COMMUNITY)
Admission: AD | Admit: 2017-07-21 | Discharge: 2017-07-24 | DRG: 807 | Disposition: A | Discharge status: Home / Self Care | Payer: Commercial Managed Care - PPO | Source: Ambulatory Visit | Attending: Obstetrics and Gynecology | Admitting: Obstetrics and Gynecology

## 2017-07-21 ENCOUNTER — Encounter (HOSPITAL_COMMUNITY): Payer: Self-pay

## 2017-07-21 ENCOUNTER — Other Ambulatory Visit: Payer: Self-pay

## 2017-07-21 ENCOUNTER — Inpatient Hospital Stay (HOSPITAL_COMMUNITY): Payer: Commercial Managed Care - PPO | Admitting: Anesthesiology

## 2017-07-21 DIAGNOSIS — O1404 Mild to moderate pre-eclampsia, complicating childbirth: Secondary | ICD-10-CM | POA: Diagnosis present

## 2017-07-21 DIAGNOSIS — O4292 Full-term premature rupture of membranes, unspecified as to length of time between rupture and onset of labor: Secondary | ICD-10-CM | POA: Diagnosis present

## 2017-07-21 DIAGNOSIS — Z3A38 38 weeks gestation of pregnancy: Secondary | ICD-10-CM

## 2017-07-21 DIAGNOSIS — O4290 Premature rupture of membranes, unspecified as to length of time between rupture and onset of labor, unspecified weeks of gestation: Secondary | ICD-10-CM | POA: Diagnosis present

## 2017-07-21 DIAGNOSIS — O1490 Unspecified pre-eclampsia, unspecified trimester: Secondary | ICD-10-CM | POA: Diagnosis present

## 2017-07-21 DIAGNOSIS — O99214 Obesity complicating childbirth: Secondary | ICD-10-CM | POA: Diagnosis present

## 2017-07-21 DIAGNOSIS — O4212 Full-term premature rupture of membranes, onset of labor more than 24 hours following rupture: Secondary | ICD-10-CM | POA: Diagnosis not present

## 2017-07-21 LAB — COMPREHENSIVE METABOLIC PANEL
ALT: 19 U/L (ref 14–54)
AST: 26 U/L (ref 15–41)
Albumin: 2.6 g/dL — ABNORMAL LOW (ref 3.5–5.0)
Alkaline Phosphatase: 145 U/L — ABNORMAL HIGH (ref 38–126)
Anion gap: 10 (ref 5–15)
BUN: 5 mg/dL — AB (ref 6–20)
CHLORIDE: 108 mmol/L (ref 101–111)
CO2: 20 mmol/L — AB (ref 22–32)
Calcium: 9.3 mg/dL (ref 8.9–10.3)
Creatinine, Ser: 0.46 mg/dL (ref 0.44–1.00)
Glucose, Bld: 92 mg/dL (ref 65–99)
POTASSIUM: 3.6 mmol/L (ref 3.5–5.1)
SODIUM: 138 mmol/L (ref 135–145)
Total Bilirubin: 0.3 mg/dL (ref 0.3–1.2)
Total Protein: 6 g/dL — ABNORMAL LOW (ref 6.5–8.1)

## 2017-07-21 LAB — CBC
HEMATOCRIT: 38.4 % (ref 36.0–46.0)
HEMATOCRIT: 38.2 % (ref 36.0–46.0)
HEMOGLOBIN: 13.5 g/dL (ref 12.0–15.0)
Hemoglobin: 13.5 g/dL (ref 12.0–15.0)
MCH: 30.7 pg (ref 26.0–34.0)
MCH: 31 pg (ref 26.0–34.0)
MCHC: 35.2 g/dL (ref 30.0–36.0)
MCHC: 35.3 g/dL (ref 30.0–36.0)
MCV: 87.3 fL (ref 78.0–100.0)
MCV: 87.8 fL (ref 78.0–100.0)
PLATELETS: 215 10*3/uL (ref 150–400)
Platelets: 230 10*3/uL (ref 150–400)
RBC: 4.4 MIL/uL (ref 3.87–5.11)
RBC: 4.35 MIL/uL (ref 3.87–5.11)
RDW: 14 % (ref 11.5–15.5)
RDW: 14.1 % (ref 11.5–15.5)
WBC: 14.7 10*3/uL — ABNORMAL HIGH (ref 4.0–10.5)
WBC: 15.8 10*3/uL — AB (ref 4.0–10.5)

## 2017-07-21 LAB — ABO/RH: ABO/RH(D): O POS

## 2017-07-21 LAB — PROTEIN / CREATININE RATIO, URINE
Creatinine, Urine: 88 mg/dL
Protein Creatinine Ratio: 0.43 mg/mg{Cre} — ABNORMAL HIGH (ref 0.00–0.15)
TOTAL PROTEIN, URINE: 38 mg/dL

## 2017-07-21 LAB — TYPE AND SCREEN
ABO/RH(D): O POS
Antibody Screen: NEGATIVE

## 2017-07-21 LAB — RPR: RPR Ser Ql: NONREACTIVE

## 2017-07-21 LAB — POCT FERN TEST: POCT Fern Test: POSITIVE

## 2017-07-21 MED ORDER — FENTANYL 2.5 MCG/ML BUPIVACAINE 1/10 % EPIDURAL INFUSION (WH - ANES)
14.0000 mL/h | INTRAMUSCULAR | Status: DC | PRN
  Administered 2017-07-21: 12 mL/h via EPIDURAL
  Filled 2017-07-21 (×2): qty 100

## 2017-07-21 MED ORDER — PHENYLEPHRINE 40 MCG/ML (10ML) SYRINGE FOR IV PUSH (FOR BLOOD PRESSURE SUPPORT)
80.0000 ug | PREFILLED_SYRINGE | INTRAVENOUS | Status: DC | PRN
  Filled 2017-07-21: qty 5
  Filled 2017-07-21: qty 10

## 2017-07-21 MED ORDER — OXYTOCIN BOLUS FROM INFUSION
500.0000 mL | Freq: Once | INTRAVENOUS | Status: AC
  Administered 2017-07-22: 500 mL via INTRAVENOUS

## 2017-07-21 MED ORDER — ONDANSETRON HCL 4 MG/2ML IJ SOLN: 4.0000 mg | Freq: Four times a day (QID) | INTRAMUSCULAR | Status: DC | PRN

## 2017-07-21 MED ORDER — OXYTOCIN 40 UNITS IN LACTATED RINGERS INFUSION - SIMPLE MED
2.5000 [IU]/h | INTRAVENOUS | Status: DC
  Filled 2017-07-21: qty 1000

## 2017-07-21 MED ORDER — LIDOCAINE HCL (PF) 1 % IJ SOLN
30.0000 mL | INTRAMUSCULAR | Status: DC | PRN
  Filled 2017-07-21: qty 30

## 2017-07-21 MED ORDER — EPHEDRINE 5 MG/ML INJ
10.0000 mg | INTRAVENOUS | Status: DC | PRN
  Filled 2017-07-21: qty 2

## 2017-07-21 MED ORDER — TERBUTALINE SULFATE 1 MG/ML IJ SOLN
0.2500 mg | Freq: Once | INTRAMUSCULAR | Status: DC | PRN
  Filled 2017-07-21 (×2): qty 1

## 2017-07-21 MED ORDER — PHENYLEPHRINE 40 MCG/ML (10ML) SYRINGE FOR IV PUSH (FOR BLOOD PRESSURE SUPPORT)
80.0000 ug | PREFILLED_SYRINGE | INTRAVENOUS | Status: DC | PRN
  Administered 2017-07-22 (×2): 80 ug via INTRAVENOUS
  Filled 2017-07-21: qty 5

## 2017-07-21 MED ORDER — HYDROXYZINE HCL 50 MG PO TABS
50.0000 mg | ORAL_TABLET | Freq: Four times a day (QID) | ORAL | Status: DC | PRN
  Filled 2017-07-21: qty 1

## 2017-07-21 MED ORDER — DIPHENHYDRAMINE HCL 50 MG/ML IJ SOLN: 12.5000 mg | INTRAMUSCULAR | Status: DC | PRN

## 2017-07-21 MED ORDER — LACTATED RINGERS IV SOLN
500.0000 mL | INTRAVENOUS | Status: DC | PRN
  Administered 2017-07-21 (×2): 1000 mL via INTRAVENOUS
  Administered 2017-07-22: 10 mL via INTRAVENOUS

## 2017-07-21 MED ORDER — OXYTOCIN 40 UNITS IN LACTATED RINGERS INFUSION - SIMPLE MED
1.0000 m[IU]/min | INTRAVENOUS | Status: DC
  Administered 2017-07-21 – 2017-07-22 (×2): 2 m[IU]/min via INTRAVENOUS

## 2017-07-21 MED ORDER — FLEET ENEMA 7-19 GM/118ML RE ENEM: 1.0000 | ENEMA | RECTAL | Status: DC | PRN

## 2017-07-21 MED ORDER — FENTANYL CITRATE (PF) 100 MCG/2ML IJ SOLN
50.0000 ug | INTRAMUSCULAR | Status: DC | PRN
  Administered 2017-07-22: 100 ug via INTRAVENOUS

## 2017-07-21 MED ORDER — LACTATED RINGERS IV SOLN
500.0000 mL | Freq: Once | INTRAVENOUS | Status: AC
  Administered 2017-07-21: 500 mL via INTRAVENOUS

## 2017-07-21 MED ORDER — SOD CITRATE-CITRIC ACID 500-334 MG/5ML PO SOLN: 30.0000 mL | ORAL | Status: DC | PRN

## 2017-07-21 MED ORDER — ACETAMINOPHEN 325 MG PO TABS: 650.0000 mg | ORAL_TABLET | ORAL | Status: DC | PRN

## 2017-07-21 NOTE — Progress Notes (Signed)
Patient ID: Adrienne Madden, female   DOB: 07-Jan-1986, 32 y.o.   MRN: [id number] Sitting on birthing ball, planning on waterbirth when she gets off Pitocin  Vitals:   07/21/17 1856 07/21/17 1920 07/21/17 1950 07/21/17 2020  BP:  116/79 127/79 116/81  Pulse:  (!) 103 92 86  Resp:      Temp: 98.2 F (36.8 C)     TempSrc: Oral     SpO2:      Weight:      Height:       Chart reviewed BPs have been elevated at intervals with normal pressures between 3 pressures have been elevated FHR reactive, Category I UCs every 2 min  Labs are remarkable for proteinuria with Protein/Creatinine ratio of 0.43  Discussed with Alene Mires  Above data meets criteria for preeclampsia This is one of the exclusionary criteria for waterbirth Risks would include fetal intolerance of labor (which we would not be able to see off monitor) and seizures.   Discussed issues with patient She is very disappointed as expected She states her urine protein is always high Chart review shows no diagnosis of chronic proteinuria.   Will give her an hour off Pitocin Then restart as needed.

## 2017-07-21 NOTE — Progress Notes (Signed)
Patient ID: Adrienne Madden, female   DOB: 1985/07/22, 32 y.o.   MRN: [id number] Adrienne Madden is a 32 y.o. G1P0000 at [redacted]w[redacted]d admitted for PROM  Subjective: Feels uc's are getting a little stronger/closer. Denies ha, visual changes, ruq/epigastric pain, n/v.    Objective: BP 139/80   Pulse 99   Temp 97.9 F (36.6 C) (Oral)   Resp 16   Ht 5\' 4"  (1.626 m)   Wt 117 kg (258 lb)   LMP 10/13/2016   SpO2 99%   BMI 44.29 kg/m  No intake/output data recorded.  FHT:  FHR: 140 bpm, variability: moderate,  accelerations:  Present,  decelerations:  Absent UC:   irregular  SVE:   Dilation: 3 Effacement (%): 70 Station: -2 Exam by:: T. LYTLE RN  Labs: Lab Results  Component Value Date   WBC 14.7 (H) 07/21/2017   HGB 13.5 07/21/2017   HCT 38.4 07/21/2017   MCV 87.3 07/21/2017   PLT 230 07/21/2017    Assessment / Plan: PROM, early labor. Had 1 mildly elevated bp I was notified of. Pt asymptomatic, will add pre-e labs. Continue to monitor bp's per protocol, if remain elevated/dx w/ GHTN,etc understands she will not be able to continue w/ plans of waterbirth  Labor: early Fetal Wellbeing:  Category I Pain Control:  Labor support without medications Pre-eclampsia: labs pending I/D:  n/a Anticipated MOD:  NSVD  [name] CNM, WHNP-BC 07/21/2017, 5:46 AM

## 2017-07-21 NOTE — Progress Notes (Signed)
Adrienne Madden is a 32 y.o. G1P0000 at [redacted]w[redacted]d by LMP admitted for PROM  Subjective:   Objective: BP (!) 129/93   Pulse (!) 108   Temp 98.3 F (36.8 C) (Oral)   Resp 18   Ht 5\' 4"  (1.626 m)   Wt 258 lb (117 kg)   LMP 10/13/2016   SpO2 99%   BMI 44.29 kg/m  No intake/output data recorded. No intake/output data recorded.  FHT:  FHR: 150 bpm, variability: moderate,  accelerations:  Present,  decelerations:  Absent (+) scalp stimulation UC:   regular, every 4-5 minutes by palpation by RN SVE:   Dilation: 4.5 Effacement (%): 80 Station: -2 Exam by:: Laury Deep, CNM Membranes stripped; patient tolerated procedure well  Labs: Lab Results  Component Value Date   WBC 14.7 (H) 07/21/2017   HGB 13.5 07/21/2017   HCT 38.4 07/21/2017   MCV 87.3 07/21/2017   PLT 230 07/21/2017    Assessment / Plan: Spontaneous labor, slow progression  Labor: Progressing normally Preeclampsia:  labs stable Fetal Wellbeing:  Category I Pain Control:  Labor support without medications and planning hydrotherapy I/D:  n/a Anticipated MOD:  NSVD  Laury Deep, MSN, CNM 07/21/2017, 2:05 PM

## 2017-07-21 NOTE — Progress Notes (Signed)
Patient ID: Adrienne Madden, female   DOB: [birth year], 32 y.o.   MRN: [id number] Adrienne Madden is a 32 y.o. G1P0000 at [redacted]w[redacted]d admitted for PROM  Subjective: Feels like uc's are getting stronger and closer. Denies ha, visual changes, ruq/epigastric pain, n/v.    Objective: BP 128/79   Pulse 94   Temp 97.9 F (36.6 C) (Oral)   Resp 18   Ht 5\' 4"  (1.626 m)   Wt 117 kg (258 lb)   LMP 10/13/2016   SpO2 99%   BMI 44.29 kg/m  No intake/output data recorded.  FHT:  FHR: 145 bpm, variability: moderate,  accelerations:  Present,  decelerations:  Absent UC:   q 4-70mins  SVE:   Dilation: 4 Effacement (%): 80 Station: -2 Exam by:: kbooker, cnm  Labs: Lab Results  Component Value Date   WBC 14.7 (H) 07/21/2017   HGB 13.5 07/21/2017   HCT 38.4 07/21/2017   MCV 87.3 07/21/2017   PLT 230 07/21/2017    Assessment / Plan: PROM, early labor, getting a little more uncomfortable. Had 1 elevated bp, pre-e labs normal except P:C ratio 0.43, states she had protein in her urine earlier in pregnancy.   Labor: Progressing normally Fetal Wellbeing:  Category I Pain Control:  Labor support without medications Pre-eclampsia: continue to monitor bp's, has only had 1 elevated so far, asymptomatic I/D:  n/a Anticipated MOD:  NSVD  Plans waterbirth, as long as bp's remain normal  [place], WHNP-BC 07/21/2017, 7:54 AM

## 2017-07-21 NOTE — Progress Notes (Signed)
Subjective: Adrienne Madden is a 33 y.o. G1P0000 at [redacted]w[redacted]d by LMP admitted for PROM.  Objective: BP 119/71   Pulse (!) 102   Temp 98.8 F (37.1 C) (Oral)   Resp 16   Ht 5\' 4"  (1.626 m)   Wt 258 lb (117 kg)   LMP 10/13/2016   SpO2 99%   BMI 44.29 kg/m    FHT:  FHR: 140 bpm, variability: moderate,  accelerations:  Present,  decelerations:  Absent UC:   regular, every 4-5 minutes, by palpation SVE:   Dilation: 5.0 Effacement (%): 80 Station: -2 Exam by:: Laury Deep, CNM  Labs: Lab Results  Component Value Date   WBC 14.7 (H) 07/21/2017   HGB 13.5 07/21/2017   HCT 38.4 07/21/2017   MCV 87.3 07/21/2017   PLT 230 07/21/2017    Assessment / Plan: Protracted latent phase  Labor: Minimal progression Preeclampsia:  n/a Fetal Wellbeing:  Category I Pain Control:  Labor support without medications and planning hydrotherapy I/D:  n/a Anticipated MOD:  NSVD   Discussed with patient and spouse that with the minimal progression, I recommend starting pitocin to achieve active labor. Once active labor and cervical dilation is achieved, pitocin can be stopped for 1 hr before getting in watertub,. Patient and spouse agrees with recommendations. Risks and benefits of augmentation of labor with pitocin reviewed. Patient verbalized an understanding of the plan of care and agrees. Dr. Elonda Husky aware and agrees with plan of care.  Laury Deep, MSN, CNM 07/21/2017, 6:30 PM

## 2017-07-21 NOTE — Progress Notes (Signed)
Patient and support person called out with questions about their options after the news that preeclampsia no longer allows them to move forward with a waterbirth as originally planned per protocol. Patient was educated on pitocin, epidurals, preeclampsia and what would constitute for a c-section. After discussing these things, patient and support person appear calm and request that a cervical exam be performed by CNM so they can make a decision together about moving forward.

## 2017-07-21 NOTE — Progress Notes (Signed)
CNM notified of elevated blood pressures, PCR and CMP ordered. CNM notified of elevated PCR results. CNM states to call when pt is ready to get in birthing tub so that provider on call can evaluate.  Rachael Fee, RN

## 2017-07-21 NOTE — Anesthesia Pain Management Evaluation Note (Signed)
  CRNA Pain Management Visit Note  Patient: Adrienne Madden, 32 y.o., female  "Hello I am a member of the anesthesia team at [facility]. We have an anesthesia team available at all times to provide care throughout the hospital, including epidural management and anesthesia for C-section. I don't know your plan for the delivery whether it a natural birth, water birth, IV sedation, nitrous supplementation, doula or epidural, but we want to meet your pain goals."   1.Was your pain managed to your expectations on prior hospitalizations?   No prior hospitalizations  2.What is your expectation for pain management during this hospitalization?     Labor support without medications  3.How can we help you reach that goal? Pt does not want to discuss pain management. Her plan is for waterbirth.  Record the patient's initial score and the patient's pain goal.   Pain: 3  Pain Goal: 10 [facility] wants you to be able to say your pain was always managed very well.  Adrienne Madden 07/21/2017

## 2017-07-21 NOTE — MAU Note (Signed)
Pt states she had SROM at 2342. Clear fluid. Denies bleeding. Cntrx 5-8 mins apart. +FM

## 2017-07-21 NOTE — H&P (Addendum)
Adrienne Madden is a 32 y.o. G1P0000 female at [redacted]w[redacted]d by 7wk u/s, presenting with SROM of clear fluid around 2350.   Reports active fetal movement, contractions: irregular, vaginal bleeding: none, membranes: ruptured, clear fluid. Initiated prenatal care at [facility] at 32 wks after transferring from [place] d/t it closing.  Desires waterbirth, attended class and consent is signed.   This pregnancy complicated by: nothing  Prenatal History/Complications:  G1  Past Medical History: Past Medical History:  Diagnosis Date  . Chlamydia 2011  . Concussion 02/2016  . History of kidney stones   . IBS (irritable bowel syndrome)    no meds  . Obesity   . Polycystic ovarian syndrome     Past Surgical History: Past Surgical History:  Procedure Laterality Date  . EXTRACORPOREAL SHOCK WAVE LITHOTRIPSY Right 08/10/2016   Procedure: RIGHT EXTRACORPOREAL SHOCK WAVE LITHOTRIPSY (ESWL);  Surgeon: Nickie Retort, MD;  Location: [facility];  Service: Urology;  Laterality: Right;  . FINGER SURGERY Right    rt hand cyst  . LAPAROSCOPIC OVARIAN CYSTECTOMY Left 10/13/2016   Procedure: LAPAROSCOPIC OVARIAN CYSTECTOMY - LEFT DERMOID CYST;  Surgeon: Emily Filbert, MD;  Location: [facility];  Service: Gynecology;  Laterality: Left;  . LITHOTRIPSY  2015  . WISDOM TOOTH EXTRACTION      Obstetrical History: OB History    Gravida  1   Para  0   Term  0   Preterm  0   AB  0   Living  0     SAB  0   TAB  0   Ectopic  0   Multiple  0   Live Births              Social History: Social History   Socioeconomic History  . Marital status: Married    Spouse name: Not on file  . Number of children: Not on file  . Years of education: Not on file  . Highest education level: Not on file  Occupational History  . Occupation: Dance movement psychotherapist  Social Needs  . Financial resource strain: Not on file  . Food insecurity:    Worry: Not on file    Inability: Not on file  . Transportation needs:     Medical: Not on file    Non-medical: Not on file  Tobacco Use  . Smoking status: Never Smoker  . Smokeless tobacco: Never Used  Substance and Sexual Activity  . Alcohol use: No    Alcohol/week: 0.5 oz    Types: 1 Standard drinks or equivalent per week  . Drug use: No  . Sexual activity: Yes    Partners: Male    Birth control/protection: None  Lifestyle  . Physical activity:    Days per week: Not on file    Minutes per session: Not on file  . Stress: Not on file  Relationships  . Social connections:    Talks on phone: Not on file    Gets together: Not on file    Attends religious service: Not on file    Active member of club or organization: Not on file    Attends meetings of clubs or organizations: Not on file    Relationship status: Not on file  Other Topics Concern  . Not on file  Social History Narrative  . Not on file    Family History: Family History  Problem Relation Age of Onset  . Diabetes Maternal Grandmother   . Diabetes Paternal Grandmother   .  Cancer Paternal Grandmother        lung/bile duct  . Cancer Paternal Grandfather        bile duct    Allergies: Allergies  Allergen Reactions  . Lactose Intolerance (Gi) Diarrhea  . Hydrocodone Nausea And Vomiting    Medications Prior to Admission  Medication Sig Dispense Refill Last Dose  . Prenatal Multivit-Min-Fe-FA (PRENATAL VITAMINS PO) Take by mouth.   07/20/2017 at Unknown time  . acetaminophen (TYLENOL) 500 MG tablet Take 500 mg by mouth every 6 (six) hours as needed.   Unknown at Unknown time    Review of Systems  Pertinent pos/neg as indicated in HPI  Blood pressure 126/78, pulse (!) 107, temperature 98.5 F (36.9 C), temperature source Oral, resp. rate 18, height 5\' 4"  (1.626 m), weight 117.1 kg (258 lb 1.9 oz), last menstrual period 10/13/2016, SpO2 99 %. General appearance: alert, cooperative and no distress Lungs: clear to auscultation bilaterally Heart: regular rate and rhythm Abdomen:  gravid, soft, non-tender Extremities: trace edema DTR's 2+  Fetal monitoring: FHR: 145 bpm, variability: moderate,  Accelerations: Present,  decelerations:  Absent Uterine activity: irregular Dilation: 3 Effacement (%): 70 Station: -2 Exam by:: T. LYTLE RN Presentation: cephalic   Prenatal labs: ABO, Rh:  O+ Antibody:  neg Rubella:  imm RPR:   neg HBsAg:   neg HIV:   neg GBS:   neg  1hr glucola: 84, A1C 5.0 Genetic screening:  NIPS neg Anatomy US: normal  Results for orders placed or performed during the hospital encounter of 07/21/17 (from the past 24 hour(s))  POCT fern test   Collection Time: 07/21/17  2:14 AM  Result Value Ref Range   POCT Fern Test Positive = ruptured amniotic membanes      Assessment:  [redacted]w[redacted]d SIUP  G1P0000  PROM  Cat 1 FHR  GBS  neg  Desires waterbirth  Plan:  Admit to BS  IV pain meds/epidural prn active labor  Expectant management  NSL  Intermittent monitoring   Anticipate NSVB   Plans to breastfeed  Contraception: paragard  Circumcision: yes  [name] CNM, WHNP-BC 07/21/2017, 2:27 AM

## 2017-07-22 ENCOUNTER — Encounter (HOSPITAL_COMMUNITY): Payer: Self-pay | Admitting: *Deleted

## 2017-07-22 DIAGNOSIS — O1404 Mild to moderate pre-eclampsia, complicating childbirth: Secondary | ICD-10-CM

## 2017-07-22 DIAGNOSIS — O4212 Full-term premature rupture of membranes, onset of labor more than 24 hours following rupture: Secondary | ICD-10-CM

## 2017-07-22 DIAGNOSIS — Z3A38 38 weeks gestation of pregnancy: Secondary | ICD-10-CM

## 2017-07-22 MED ORDER — GENTAMICIN SULFATE 40 MG/ML IJ SOLN
180.0000 mg | Freq: Three times a day (TID) | INTRAVENOUS | Status: DC
  Administered 2017-07-22: 180 mg via INTRAVENOUS
  Filled 2017-07-22 (×3): qty 4.5

## 2017-07-22 MED ORDER — ACETAMINOPHEN 325 MG PO TABS: 650.0000 mg | ORAL_TABLET | ORAL | Status: DC | PRN

## 2017-07-22 MED ORDER — DIPHENHYDRAMINE HCL 25 MG PO CAPS: 25.0000 mg | ORAL_CAPSULE | Freq: Four times a day (QID) | ORAL | Status: DC | PRN

## 2017-07-22 MED ORDER — FENTANYL CITRATE (PF) 100 MCG/2ML IJ SOLN
INTRAMUSCULAR | Status: AC
  Filled 2017-07-22: qty 2

## 2017-07-22 MED ORDER — ONDANSETRON HCL 4 MG PO TABS: 4.0000 mg | ORAL_TABLET | ORAL | Status: DC | PRN

## 2017-07-22 MED ORDER — BUPIVACAINE HCL (PF) 0.25 % IJ SOLN
INTRAMUSCULAR | Status: DC | PRN
  Administered 2017-07-22: 5 mL via EPIDURAL

## 2017-07-22 MED ORDER — BENZOCAINE-MENTHOL 20-0.5 % EX AERO
1.0000 "application " | INHALATION_SPRAY | CUTANEOUS | Status: DC | PRN
  Administered 2017-07-22: 1 via TOPICAL
  Filled 2017-07-22: qty 56

## 2017-07-22 MED ORDER — SODIUM CHLORIDE 0.9 % IV SOLN
2.0000 g | Freq: Four times a day (QID) | INTRAVENOUS | Status: DC
  Administered 2017-07-22: 2 g via INTRAVENOUS
  Filled 2017-07-22: qty 2000
  Filled 2017-07-22: qty 2
  Filled 2017-07-22: qty 2000

## 2017-07-22 MED ORDER — SENNOSIDES-DOCUSATE SODIUM 8.6-50 MG PO TABS
2.0000 | ORAL_TABLET | ORAL | Status: DC
  Administered 2017-07-22 – 2017-07-23 (×2): 2 via ORAL
  Filled 2017-07-22 (×2): qty 2

## 2017-07-22 MED ORDER — COCONUT OIL OIL: 1.0000 "application " | TOPICAL_OIL | Status: DC | PRN

## 2017-07-22 MED ORDER — LACTATED RINGERS IV SOLN
INTRAVENOUS | Status: DC
  Administered 2017-07-22: 04:00:00 via INTRAUTERINE

## 2017-07-22 MED ORDER — LACTATED RINGERS IV SOLN: INTRAVENOUS | Status: DC

## 2017-07-22 MED ORDER — LIDOCAINE HCL (PF) 1 % IJ SOLN
INTRAMUSCULAR | Status: DC | PRN
  Administered 2017-07-21: 2 mL via EPIDURAL
  Administered 2017-07-21 (×2): 5 mL via EPIDURAL

## 2017-07-22 MED ORDER — WITCH HAZEL-GLYCERIN EX PADS
1.0000 "application " | MEDICATED_PAD | CUTANEOUS | Status: DC | PRN
  Administered 2017-07-22: 1 via TOPICAL

## 2017-07-22 MED ORDER — DIBUCAINE 1 % RE OINT
1.0000 "application " | TOPICAL_OINTMENT | RECTAL | Status: DC | PRN
  Administered 2017-07-22: 1 via RECTAL
  Filled 2017-07-22: qty 28

## 2017-07-22 MED ORDER — ZOLPIDEM TARTRATE 5 MG PO TABS: 5.0000 mg | ORAL_TABLET | Freq: Every evening | ORAL | Status: DC | PRN

## 2017-07-22 MED ORDER — IBUPROFEN 600 MG PO TABS
600.0000 mg | ORAL_TABLET | Freq: Four times a day (QID) | ORAL | Status: DC
  Administered 2017-07-22 – 2017-07-24 (×8): 600 mg via ORAL
  Filled 2017-07-22 (×8): qty 1

## 2017-07-22 MED ORDER — PRENATAL MULTIVITAMIN CH
1.0000 | ORAL_TABLET | Freq: Every day | ORAL | Status: DC
  Administered 2017-07-23 – 2017-07-24 (×2): 1 via ORAL
  Filled 2017-07-22 (×2): qty 1

## 2017-07-22 MED ORDER — LACTATED RINGERS IV SOLN: 500.0000 mL | Freq: Once | INTRAVENOUS | Status: DC

## 2017-07-22 MED ORDER — SIMETHICONE 80 MG PO CHEW: 80.0000 mg | CHEWABLE_TABLET | ORAL | Status: DC | PRN

## 2017-07-22 MED ORDER — ONDANSETRON HCL 4 MG/2ML IJ SOLN: 4.0000 mg | INTRAMUSCULAR | Status: DC | PRN

## 2017-07-22 MED ORDER — TETANUS-DIPHTH-ACELL PERTUSSIS 5-2.5-18.5 LF-MCG/0.5 IM SUSP: 0.5000 mL | Freq: Once | INTRAMUSCULAR | Status: DC

## 2017-07-22 NOTE — Anesthesia Procedure Notes (Signed)
Epidural Patient location during procedure: OB Start time: 07/21/2017 11:46 PM End time: 07/21/2017 11:51 PM  Staffing Anesthesiologist: Catalina Gravel, MD Performed: anesthesiologist   Preanesthetic Checklist Completed: patient identified, pre-op evaluation, timeout performed, IV checked, risks and benefits discussed and monitors and equipment checked  Epidural Patient position: sitting Prep: DuraPrep Patient monitoring: blood pressure and continuous pulse ox Approach: midline Location: L3-L4 Injection technique: LOR air  Needle:  Needle type: Tuohy  Needle gauge: 17 G Needle length: 9 cm Needle insertion depth: 9 cm Catheter size: 19 Gauge Catheter at skin depth: 15 cm Test dose: negative and Other (1% Lidocaine)  Additional Notes Patient identified.  Risk benefits discussed including failed block, incomplete pain control, headache, nerve damage, paralysis, blood pressure changes, nausea, vomiting, reactions to medication both toxic or allergic, and postpartum back pain.  Patient expressed understanding and wished to proceed.  All questions were answered.  Sterile technique used throughout procedure and epidural site dressed with sterile barrier dressing. No paresthesia or other complications noted. The patient did not experience any signs of intravascular injection such as tinnitus or metallic taste in mouth nor signs of intrathecal spread such as rapid motor block. Please see nursing notes for vital signs. Reason for block:procedure for pain

## 2017-07-22 NOTE — Plan of Care (Signed)
Pt and partner educated on labor process, feeling pressure, pain management, and plan of care. Pt repositioned and assisted with pain management.  Will continue to monitor and modify care and communicate with healthcare team.

## 2017-07-22 NOTE — Progress Notes (Signed)
ANTIBIOTIC CONSULT NOTE - INITIAL  Pharmacy Consult for Gentamicin Indication: ROM >36H  Allergies  Allergen Reactions  . Lactose Intolerance (Gi) Diarrhea  . Hydrocodone Nausea And Vomiting    Patient Measurements: Height: 5\' 4"  (162.6 cm) Weight: 258 lb (117 kg) IBW/kg (Calculated) : 54.7 Adjusted Body Weight: 73.4 kg  Vital Signs: Temp: 98.3 F (36.8 C) (04/11 0914) Temp Source: Oral (04/11 0914) BP: 142/91 (04/11 0929) Pulse Rate: 83 (04/11 0929)  Labs: Recent Labs    07/21/17 0246 07/21/17 0535 07/21/17 0557 07/21/17 2320  WBC 14.7*  --   --  15.8*  HGB 13.5  --   --  13.5  PLT 230  --   --  215  LABCREA  --  88.00  --   --   CREATININE  --   --  0.46  --    No results for input(s): GENTTROUGH, GENTPEAK, GENTRANDOM in the last 72 hours.   Microbiology: Recent Results (from the past 720 hour(s))  OB RESULT CONSOLE Group B Strep     Status: None   Collection Time: 07/02/17 12:00 AM  Result Value Ref Range Status   GBS Negative  Final  Culture, beta strep (group b only)     Status: None   Collection Time: 07/02/17  8:33 AM  Result Value Ref Range Status   MICRO NUMBER: [id number]  Final   SPECIMEN QUALITY: ADEQUATE  Final   SOURCE: NOT GIVEN  Final   STATUS: FINAL  Final   RESULT: No group B Streptococcus isolated  Final   COMMENT:   Final    Note per CDC guidelines optimal recovery is achieved by swabbing both the lower vagina and rectum (through the anal sphincter).    Medications:  Ampicillin 2gm Q6H  Assessment: 32 y.o. female G1P0000 at [redacted]w[redacted]d Estimated Ke = 0.459 hr-1, Vd = 25.7L  Goal of Therapy:  Gentamicin peak 6-8 mg/L and Trough < 1 mg/L  Plan:  Gentamicin 180 mg IV every 8 hrs  Check Scr with next labs if gentamicin continued. Will check gentamicin levels if continued > 72hr or clinically indicated.  Stefanie Libel 07/22/2017,9:32 AM

## 2017-07-22 NOTE — Progress Notes (Signed)
Patient ID: Adrienne Madden, female   DOB: 1985-10-11, 32 y.o.   MRN: [id number] Patient seen at 0200 for exam Comfortable with epidural  Vitals:   07/22/17 0015 07/22/17 0020 07/22/17 0025 07/22/17 0030  BP: 113/60 (!) 114/52 125/84 121/76  Pulse: 87 (!) 154 100 94  Resp:      Temp:      TempSrc:      SpO2: 99% 99%    Weight:      Height:       FHR reactive UCs every 3-4 min  Dilation: 5.5 Effacement (%): 80, 90 Station: -1 Presentation: Vertex Exam by:: Hansel Feinstein, CNM  IUPC inserted Will increase Pitocin

## 2017-07-22 NOTE — Progress Notes (Signed)
Patient ID: Adrienne Madden, female   DOB: [birth year], 32 y.o.   MRN: [id number] FHR now with several variable decelerations  Vitals:   07/22/17 0015 07/22/17 0020 07/22/17 0025 07/22/17 0030  BP: 113/60 (!) 114/52 125/84 121/76  Pulse: 87 (!) 154 100 94  Resp:      Temp:      TempSrc:      SpO2: 99% 99%    Weight:      Height:       UCs adequate per IUPC Cervix 6/90/-1-2/vertex  WIll start amnioinfusion and turn on side on peanut

## 2017-07-22 NOTE — Anesthesia Preprocedure Evaluation (Addendum)
Anesthesia Evaluation  Patient identified by MRN, date of birth, ID band Patient awake    Reviewed: Allergy & Precautions, NPO status , Patient's Chart, lab work & pertinent test results  Airway Mallampati: II  TM Distance: >3 FB Neck ROM: Full    Dental  (+) Teeth Intact, Dental Advisory Given   Pulmonary neg pulmonary ROS,    Pulmonary exam normal breath sounds clear to auscultation       Cardiovascular Exercise Tolerance: Good negative cardio ROS Normal cardiovascular exam Rhythm:Regular Rate:Normal     Neuro/Psych negative neurological ROS     GI/Hepatic negative GI ROS, Neg liver ROS,   Endo/Other  Morbid obesity  Renal/GU negative Renal ROS     Musculoskeletal negative musculoskeletal ROS (+)   Abdominal   Peds  Hematology Plt 215k   Anesthesia Other Findings Day of surgery medications reviewed with the patient.  Reproductive/Obstetrics (+) Pregnancy Pre-eclampsia                            Anesthesia Physical Anesthesia Plan  ASA: III  Anesthesia Plan: Epidural   Post-op Pain Management:    Induction:   PONV Risk Score and Plan: 2 and Treatment may vary due to age or medical condition  Airway Management Planned:   Additional Equipment:   Intra-op Plan:   Post-operative Plan:   Informed Consent: I have reviewed the patients History and Physical, chart, labs and discussed the procedure including the risks, benefits and alternatives for the proposed anesthesia with the patient or authorized representative who has indicated his/her understanding and acceptance.   Dental advisory given  Plan Discussed with:   Anesthesia Plan Comments: (Patient identified. Risks/Benefits/Options discussed with patient including but not limited to bleeding, infection, nerve damage, paralysis, failed block, incomplete pain control, headache, blood pressure changes, nausea, vomiting,  reactions to medication both or allergic, itching and postpartum back pain. Confirmed with bedside nurse the patient's most recent platelet count. Confirmed with patient that they are not currently taking any anticoagulation, have any bleeding history or any family history of bleeding disorders. Patient expressed understanding and wished to proceed. All questions were answered. )        Anesthesia Quick Evaluation

## 2017-07-22 NOTE — Progress Notes (Signed)
Patient ID: Adrienne Madden, female   DOB: 27-Jul-1985, 32 y.o.   MRN: [id number] Sleeping.   Vitals:   07/22/17 0430 07/22/17 0500 07/22/17 0530 07/22/17 0600  BP: (!) 119/99 (!) 120/53 (!) 128/53 119/74  Pulse: (!) 115 (!) 104 (!) 109 (!) 116  Resp:      Temp: 99.2 F (37.3 C)     TempSrc: Oral     SpO2:      Weight:      Height:       FHR reactive. Intermittent small variable decels, but overall reassuring UCs adequate per IUPC  Dilation: 7.5 Effacement (%): 90 Station: -1 Presentation: Vertex Exam by:: Janann August, RN  Will continue to observe

## 2017-07-22 NOTE — Anesthesia Postprocedure Evaluation (Signed)
Anesthesia Post Note  Patient: Adrienne Madden  Procedure(s) Performed: AN AD HOC LABOR EPIDURAL     Patient location during evaluation: Mother Baby Anesthesia Type: Epidural Level of consciousness: awake and alert Pain management: pain level controlled Vital Signs Assessment: post-procedure vital signs reviewed and stable Respiratory status: spontaneous breathing, nonlabored ventilation and respiratory function stable Cardiovascular status: stable Postop Assessment: no headache, no backache, epidural receding, adequate PO intake, no apparent nausea or vomiting and patient able to bend at knees Anesthetic complications: no    Last Vitals:  Vitals:   07/22/17 1430 07/22/17 1530  BP: (!) 118/57 126/67  Pulse: (!) 107 (!) 113  Resp:  18  Temp:  37.6 C  SpO2:      Last Pain:  Vitals:   07/22/17 1545  TempSrc:   PainSc: 0-No pain   Pain Goal:                 [employer]

## 2017-07-22 NOTE — Progress Notes (Addendum)
Subjective: In a large amount of pain despite multiple doses of PCA. Progressing well, having numerous spontaneous contractions.    Objective: BP 119/73   Pulse (!) 122   Temp 98.5 F (36.9 C) (Oral)   Resp 20   Ht 5\' 4"  (1.626 m)   Wt 117 kg (258 lb)   LMP 10/13/2016   SpO2 99%   BMI 44.29 kg/m  No intake/output data recorded. No intake/output data recorded.  FHT:  FHR: 130 bpm, variability: moderate,  accelerations:  Present,  decelerations: variable, Present occasionally with contractions, position dependent UC:   regular, every 2 minutes SVE:   Dilation: 9 Effacement (%): 100 Station: -1 Exam by:: K Faucett RN  Labs: Lab Results  Component Value Date   WBC 15.8 (H) 07/21/2017   HGB 13.5 07/21/2017   HCT 38.2 07/21/2017   MCV 87.8 07/21/2017   PLT 215 07/21/2017    Assessment / Plan:  Augmentation of labor, progressing well  Labor: Progressing on Pitociin Preeclampsia:  N/A Fetal Wellbeing:  Category II Pain Control:  IV pain meds I/D:  Starting amp and gent due to ROM > 33 hours ago Anticipated MOD:  NSVD  Adrienne Madden 07/22/2017, 9:12 AM

## 2017-07-23 DIAGNOSIS — O1490 Unspecified pre-eclampsia, unspecified trimester: Secondary | ICD-10-CM | POA: Diagnosis present

## 2017-07-23 NOTE — Lactation Note (Addendum)
This note was copied from a baby's chart. Lactation Consultation Note  Patient Name: Adrienne Madden [id number] Date: 07/23/2017 Reason for consult: Initial assessment   Baby 2 hours old. Hx PCOS. Cephalohematoma. Mother states she had slight increase in size during pregnancy and has been leaking colostrum.  Baby has been sleepy but is starting to bf longer and more frequently. Mother hand expressed good flow of colostrum.  Gave baby drops on spoon to interest him in feeding.  Baby STS with mother. Assisted w/ football hold.  LC observed latch for 15 min with swallows. Suggest mother post pump after feedings for 10 min per side w/ manual pump. Mother states FOB is bringing her DEBP from home. Give volume expressed back to baby on spoon. Mom encouraged to feed baby 8-12 times/24 hours and with feeding cues.  Mom made aware of O/P services, breastfeeding support groups, community resources, and our phone # for post-discharge questions.     Maternal Data Has patient been taught Hand Expression?: Yes Does the patient have breastfeeding experience prior to this delivery?: No  Feeding Feeding Type: Breast Fed Length of feed: 15 min(observed for 15 )  LATCH Score Latch: Grasps breast easily, tongue down, lips flanged, rhythmical sucking.  Audible Swallowing: A few with stimulation  Type of Nipple: Everted at rest and after stimulation  Comfort (Breast/Nipple): Soft / non-tender  Hold (Positioning): Assistance needed to correctly position infant at breast and maintain latch.  LATCH Score: 8  Interventions Interventions: Breast feeding basics reviewed;Assisted with latch;Skin to skin;Breast massage;Hand express;Breast compression;Adjust position;Support pillows;Position options;Hand pump  Lactation Tools Discussed/Used     Consult Status Consult Status: Follow-up Date: 07/24/17 Follow-up type: In-patient    [name] [facility] 07/23/2017, 11:20 AM

## 2017-07-23 NOTE — Progress Notes (Signed)
POSTPARTUM PROGRESS NOTE  Post Partum Day #1 Subjective:  Adrienne Madden is a 32 y.o. G1P1001 [redacted]w[redacted]d s/p SVD with ROM >24 hours.  No acute events overnight.  Pt denies problems with ambulating, voiding, or po intake.  She denies nausea or vomiting.  Pain is well controlled. She reports only feeling very mild cramping with positional changes. She is still experiencing some vaginal bleeding but states it is improved from yesterday. No clots present as of last night.  Patient is still planning on breast feeding exclusively. For postpartum birth control she is planning on getting the paragard. Baby is going to receive a circumcision inpatient at this hospital later today.   Objective: Blood pressure 116/69, pulse 92, temperature 97.8 F (36.6 C), temperature source Oral, resp. rate 16, height 5\' 4"  (1.626 m), weight 117 kg (258 lb), last menstrual period 10/13/2016, SpO2 96 %, unknown if currently breastfeeding.  Physical Exam:  General: alert, cooperative and no distress Lochia:normal flow Chest: no respiratory distress Heart:regular rate, distal pulses intact Abdomen: soft, nontender,  Uterine Fundus: firm, appropriately tender DVT Evaluation: No calf swelling or tenderness Extremities: no edema  Recent Labs    07/21/17 0246 07/21/17 2320  HGB 13.5 13.5  HCT 38.4 38.2    Assessment/Plan:  ASSESSMENT: Adrienne Madden is a 32 y.o. G1P1001 [redacted]w[redacted]d s/p SVD complicated by ROM >96 hours before delivery. She is doing well and in no acute distress. Expresses no concerns at this time.    Plan for discharge tomorrow, Breastfeeding, Circumcision prior to discharge and Contraception paragard   LOS: 2 days   [facility], Student-PA 07/23/2017, 7:12 AM

## 2017-07-24 NOTE — Lactation Note (Signed)
This note was copied from a baby's chart. Lactation Consultation Note  Patient Name: Adrienne Madden Date: 07/24/2017 Reason for consult: Follow-up assessment   P1, Baby 65 hours old and recently circumcised. Mother has history of PCOS. She recently pumped 98ml. Encouraged pumping q hours if not latching to help establish her milk supply. Mother has been primarily formula feeding. Mom encouraged to feed baby 8-12 times/24 hours and with feeding cues.  Reviewed engorgement care and monitoring voids/stools.    Maternal Data    Feeding Feeding Type: Formula Nipple Type: Slow - flow  LATCH Score                   Interventions    Lactation Tools Discussed/Used     Consult Status Consult Status: Complete Date: 07/24/17    [name] [facility] 07/24/2017, 12:28 PM

## 2017-07-24 NOTE — Discharge Summary (Signed)
OB Discharge Summary     Patient Name: Adrienne Madden DOB: [birth year] MRN: [id number]  Date of admission: 07/21/2017 Delivering MD: Katheren Shams   Date of discharge: 07/24/2017  Admitting diagnosis: 47 WEEKS ROM Intrauterine pregnancy: [redacted]w[redacted]d     Secondary diagnosis:  Principal Problem:   SVD (spontaneous vaginal delivery) Active Problems:   Amniotic fluid leaking   Preeclampsia  Additional problems:      Discharge diagnosis: Term Pregnancy Delivered and Preeclampsia (mild)                                                                                                Post partum procedures:none   Augmentation: Pitocin  Complications: None  Hospital course:  Onset of Labor With Vaginal Delivery     32 y.o. yo G1P1001 at [redacted]w[redacted]d was admitted in Latent Labor on 07/21/2017. Patient had an uncomplicated labor course as follows:  Membrane Rupture Time/Date: 11:42 PM ,07/20/2017   Intrapartum Procedures: Episiotomy: None [1]                                         Lacerations:  Vaginal [6]  Patient had a delivery of a Viable infant. 07/22/2017  Information for the patient's newborn:  Javionna, Leder [[id number]]       Pateint had an uncomplicated postpartum course.  She is ambulating, tolerating a regular diet, passing flatus, and urinating well. Patient is discharged home in stable condition on 07/24/17.   Physical exam  Vitals:   07/23/17 0503 07/23/17 1809 07/23/17 1900 07/24/17 0526  BP: 116/69 130/83 130/70 119/70  Pulse: 92 87 (!) 101 89  Resp: 16 18 18 18   Temp: 97.8 F (36.6 C) 98.2 F (36.8 C) 98.3 F (36.8 C) 97.9 F (36.6 C)  TempSrc: Oral Oral Oral Oral  SpO2: 96%  95%   Weight:      Height:       General: alert, cooperative and no distress Lochia: appropriate Uterine Fundus: firm Incision: N/A DVT Evaluation: No evidence of DVT seen on physical exam. Labs: Lab Results  Component Value Date   WBC 15.8 (H) 07/21/2017   HGB 13.5 07/21/2017   HCT 38.2 07/21/2017   MCV 87.8 07/21/2017   PLT 215 07/21/2017   CMP Latest Ref Rng & Units 07/21/2017  Glucose 65 - 99 mg/dL 92  BUN 6 - 20 mg/dL 5(L)  Creatinine 0.44 - 1.00 mg/dL 0.46  Sodium 135 - 145 mmol/L 138  Potassium 3.5 - 5.1 mmol/L 3.6  Chloride 101 - 111 mmol/L 108  CO2 22 - 32 mmol/L 20(L)  Calcium 8.9 - 10.3 mg/dL 9.3  Total Protein 6.5 - 8.1 g/dL 6.0(L)  Total Bilirubin 0.3 - 1.2 mg/dL 0.3  Alkaline Phos 38 - 126 U/L 145(H)  AST 15 - 41 U/L 26  ALT 14 - 54 U/L 19    Discharge instruction: per After Visit Summary and "Baby and Me Booklet".  After visit meds:  Allergies as of 07/24/2017  Reactions   Lactose Intolerance (gi) Diarrhea   Hydrocodone Nausea And Vomiting      Medication List    TAKE these medications   acetaminophen 500 MG tablet Commonly known as:  TYLENOL Take 1,000 mg by mouth every 6 (six) hours as needed for mild pain or headache.   PRENATAL VITAMINS PO Take 1 tablet by mouth daily.       Diet: routine diet  Activity: Advance as tolerated. Pelvic rest for 6 weeks.   Outpatient follow up:3 days for a blood pressure check. Message sent to [facility].  Follow up Appt:No future appointments. Follow up Visit:No follow-ups on file.  Postpartum contraception: IUD Paragard  Newborn Data: Live born female  Birth Weight: 7 lb 10.2 oz (3465 g) APGAR: 4, 6  Newborn Delivery   Birth date/time:  07/22/2017 12:58:00 Delivery type:  Vaginal, Spontaneous     Baby Feeding: Breast Disposition:home with mother   07/24/2017 Marcille Buffy, CNM

## 2017-07-27 ENCOUNTER — Telehealth: Payer: Self-pay | Admitting: *Deleted

## 2017-07-27 ENCOUNTER — Encounter: Payer: Self-pay | Admitting: *Deleted

## 2017-07-27 ENCOUNTER — Ambulatory Visit: Payer: Commercial Managed Care - PPO | Admitting: *Deleted

## 2017-07-27 VITALS — BP 121/68 | HR 72 | Ht 64.0 in | Wt 242.0 lb

## 2017-07-27 DIAGNOSIS — Z3400 Encounter for supervision of normal first pregnancy, unspecified trimester: Secondary | ICD-10-CM

## 2017-07-27 NOTE — Progress Notes (Signed)
Pt here for a BP check 1 week post delivery.  BP this AM is 121/68 P-72.  Pt will return on 08/23/17 for PP visit.

## 2017-07-27 NOTE — Telephone Encounter (Signed)
Patient seen today for BP check per provider on 07/26/17.

## 2017-07-29 NOTE — Telephone Encounter (Signed)
Error

## 2017-08-23 ENCOUNTER — Encounter: Payer: Self-pay | Admitting: Advanced Practice Midwife

## 2017-08-23 ENCOUNTER — Ambulatory Visit (INDEPENDENT_AMBULATORY_CARE_PROVIDER_SITE_OTHER): Payer: Commercial Managed Care - PPO | Admitting: Advanced Practice Midwife

## 2017-08-23 VITALS — BP 111/66 | HR 84 | Ht 64.0 in | Wt 237.0 lb

## 2017-08-23 DIAGNOSIS — Z01812 Encounter for preprocedural laboratory examination: Secondary | ICD-10-CM

## 2017-08-23 DIAGNOSIS — Z1389 Encounter for screening for other disorder: Secondary | ICD-10-CM

## 2017-08-23 DIAGNOSIS — Z30430 Encounter for insertion of intrauterine contraceptive device: Secondary | ICD-10-CM

## 2017-08-23 DIAGNOSIS — Z3202 Encounter for pregnancy test, result negative: Secondary | ICD-10-CM | POA: Diagnosis not present

## 2017-08-23 DIAGNOSIS — Z3009 Encounter for other general counseling and advice on contraception: Secondary | ICD-10-CM

## 2017-08-23 LAB — POCT URINE PREGNANCY: PREG TEST UR: NEGATIVE

## 2017-08-23 MED ORDER — PARAGARD INTRAUTERINE COPPER IU IUD
INTRAUTERINE_SYSTEM | Freq: Once | INTRAUTERINE | Status: AC
  Administered 2017-08-23: 11:00:00 via INTRAUTERINE

## 2017-08-23 MED ORDER — LEVONORGESTREL 20 MCG/24HR IU IUD: INTRAUTERINE_SYSTEM | Freq: Once | INTRAUTERINE | Status: DC

## 2017-08-23 NOTE — Patient Instructions (Signed)

## 2017-08-23 NOTE — Progress Notes (Signed)
Post Partum Exam  Adrienne Madden is a 32 y.o. G74P1001 female who presents for a postpartum visit. She is 4 week postpartum following a spontaneous vaginal delivery. I have fully reviewed the prenatal and intrapartum course. The delivery was at 40 gestational weeks.  Anesthesia: epidural. Postpartum course has been unremarkable. Baby's course has been unremarkable. Baby is feeding by both breast and bottle - Similac Sensitive RS. Bleeding thin lochia. Bowel function is normal. Bladder function is normal. Patient is not sexually active. Contraception method is IUD. Postpartum depression screening:neg  The following portions of the patient's history were reviewed and updated as appropriate: allergies, current medications, past family history, past medical history, past social history, past surgical history and problem list. Last pap smear done 2017 and was Normal  Review of Systems Pertinent items noted in HPI and remainder of comprehensive ROS otherwise negative.    Objective:  Blood pressure 111/66, pulse 84, height 5\' 4"  (1.626 m), weight 237 lb (107.5 kg), currently breastfeeding.  VS reviewed, nursing note reviewed,  Constitutional: well developed, well nourished, no distress HEENT: normocephalic CV: normal rate Pulm/chest wall: normal effort Breast Exam:  deferred Abdomen: soft Neuro: alert and oriented x 3 Skin: warm, dry Psych: affect normal Pelvic exam: Cervix pink, visually closed, without lesion, scant white creamy discharge, vaginal walls and external genitalia normal Perineum with no edema or erythema, well approximated with small amount of suture visible on surface of perineum   IUD Procedure Note Patient identified, informed consent performed.  Discussed risks of irregular bleeding, cramping, infection, malpositioning or misplacement of the IUD outside the uterus which may require further procedures. Time out was performed.  Urine pregnancy test negative.  Speculum placed  in the vagina.  Cervix visualized.  Cleaned with Betadine x 2.  Grasped anteriorly with a single tooth tenaculum.  Uterus sounded to 8 cm.  Mirena IUD placed per manufacturer's recommendations.  Strings trimmed to 3 cm. Tenaculum was removed, good hemostasis noted.  Patient tolerated procedure well.   Patient was given post-procedure instructions and the Mirena care card with expiration date.  Patient was also asked to check IUD strings periodically and follow up in 4-6 weeks for IUD check.  Assessment/Plan:   1. Encounter for preprocedural laboratory examination --Pregnancy test negative - POCT urine pregnancy  2. Postpartum care and examination --Pt doing well, adjusting well to new baby, good support at home  3. Encounter for insertion of intrauterine contraceptive device (IUD) --Pt initially desired Paragard IUD because she has a friend with PCOS who prefers the Paragard.   --Discussed Paragard vs Mirena, both are available today for insertion.  After discussing benefits/risks/side effects of both, pt opts to try IUD with hormones for lighter bleeding. If it causes any problems for her, it can be removed in the future and a Paragard placed. -Mirena IUD placed without difficulty, see insertion note above.  4. Encounter for general counseling and advice on contraceptive management   Fatima Blank, CNM 10:16 PM

## 2017-09-20 ENCOUNTER — Encounter: Payer: Self-pay | Admitting: Advanced Practice Midwife

## 2017-09-20 ENCOUNTER — Ambulatory Visit (INDEPENDENT_AMBULATORY_CARE_PROVIDER_SITE_OTHER): Payer: Commercial Managed Care - PPO | Admitting: Advanced Practice Midwife

## 2017-09-20 VITALS — BP 112/75 | HR 80 | Wt 235.0 lb

## 2017-09-20 DIAGNOSIS — K429 Umbilical hernia without obstruction or gangrene: Secondary | ICD-10-CM

## 2017-09-20 DIAGNOSIS — Z975 Presence of (intrauterine) contraceptive device: Secondary | ICD-10-CM

## 2017-09-20 DIAGNOSIS — Z30431 Encounter for routine checking of intrauterine contraceptive device: Secondary | ICD-10-CM | POA: Diagnosis not present

## 2017-09-20 NOTE — Progress Notes (Signed)
  GYNECOLOGY CLINIC PROGRESS NOTE  History:  32 y.o. G1P1001 here today for today for IUD string check; Liletta IUD was placed  08/23/17. No complaints about the Liletta, no concerning side effects.  She does report noticing that a place in her abdomen, above her navel, sticks out sometimes. It is not painful. There are no other associated symptoms.  The following portions of the patient's history were reviewed and updated as appropriate: allergies, current medications, past family history, past medical history, past social history, past surgical history and problem list. Last pap smear on 11/21/15 was normal, negative HRHPV.  Review of Systems:  Pertinent items are noted in HPI.   Objective:  Physical Exam Blood pressure 112/75, pulse 80, weight 235 lb (106.6 kg), currently breastfeeding. Gen: NAD Abd: Soft, nontender and nondistended, mild umbilical hernia noted with 3-4 cm protrusion of soft tissue only with intraabdominal pressure, reduces easily, no pain to palpation of area Pelvic: Normal appearing external genitalia; normal appearing vaginal mucosa and cervix.  IUD strings visualized, about 4 cm in length outside cervix.   Assessment & Plan:   1. IUD check up -Normal IUD check. --Patient to keep IUD in place for five years; can come in for removal if she desires pregnancy within the next five years. --Routine preventative health maintenance measures emphasized.  2. Umbilical hernia without obstruction and without gangrene --Mild, not painful. Pt is 8 weeks PP, may continue to improve. --Warning signs of incarceration reviewed --Pt Ok with conservative management, just wanted to know if it was a hernia.   Fatima Blank, CNM 8:49 AM

## 2018-09-23 DIAGNOSIS — K582 Mixed irritable bowel syndrome: Secondary | ICD-10-CM | POA: Insufficient documentation

## 2018-09-23 DIAGNOSIS — E282 Polycystic ovarian syndrome: Secondary | ICD-10-CM | POA: Insufficient documentation

## 2019-04-04 DIAGNOSIS — Z9884 Bariatric surgery status: Secondary | ICD-10-CM | POA: Insufficient documentation

## 2021-02-18 LAB — OB RESULTS CONSOLE RPR: RPR: NONREACTIVE

## 2021-02-18 LAB — OB RESULTS CONSOLE ANTIBODY SCREEN: Antibody Screen: NEGATIVE

## 2021-02-18 LAB — OB RESULTS CONSOLE RUBELLA ANTIBODY, IGM: Rubella: IMMUNE

## 2021-02-18 LAB — OB RESULTS CONSOLE HIV ANTIBODY (ROUTINE TESTING): HIV: NONREACTIVE

## 2021-02-18 LAB — HEPATITIS C ANTIBODY: HCV Ab: NEGATIVE

## 2021-02-18 LAB — OB RESULTS CONSOLE HEPATITIS B SURFACE ANTIGEN: Hepatitis B Surface Ag: NEGATIVE

## 2021-02-18 LAB — OB RESULTS CONSOLE ABO/RH: RH Type: POSITIVE

## 2021-04-13 NOTE — L&D Delivery Note (Signed)
Patient complete and pushing. SVD of viable female infant over intact perineum. Nuchal cord x 1 reduced . Infant delivered to mom's abdomen. Delayed cord clamping x 1 minute. Cord clamped x 2, cut. Spontaneous cry heard. Weight and Apgars pending. Cord blood obtained. Placenta delivered spontaneously and intact. LUS cleared of clot x 2 and uterus is firm Vagina inspected.no lacerations noted.  EBL: 300cc Anesthesia: epidural, local

## 2021-06-02 ENCOUNTER — Telehealth: Payer: Self-pay | Admitting: *Deleted

## 2021-06-02 NOTE — Telephone Encounter (Signed)
Patient wants to transfer [facility] care from [place], she wants to know if we have received records. Patient was informed that the office not received medical records as of this time today, but will call her when we do or at 4:30 with an update.

## 2021-06-04 LAB — CYTOLOGY - PAP
HPV DNA High Risk: NEGATIVE
Pap: NEGATIVE

## 2021-06-09 ENCOUNTER — Encounter: Payer: Self-pay | Admitting: *Deleted

## 2021-06-16 ENCOUNTER — Encounter: Payer: Self-pay | Admitting: *Deleted

## 2021-06-16 DIAGNOSIS — Z3490 Encounter for supervision of normal pregnancy, unspecified, unspecified trimester: Secondary | ICD-10-CM | POA: Insufficient documentation

## 2021-06-17 ENCOUNTER — Other Ambulatory Visit: Payer: Self-pay

## 2021-06-17 ENCOUNTER — Ambulatory Visit (INDEPENDENT_AMBULATORY_CARE_PROVIDER_SITE_OTHER): Payer: Medicaid Other | Admitting: Advanced Practice Midwife

## 2021-06-17 ENCOUNTER — Encounter: Payer: Self-pay | Admitting: Advanced Practice Midwife

## 2021-06-17 VITALS — BP 117/70 | HR 83 | Wt 174.0 lb

## 2021-06-17 DIAGNOSIS — F32A Depression, unspecified: Secondary | ICD-10-CM

## 2021-06-17 DIAGNOSIS — O26843 Uterine size-date discrepancy, third trimester: Secondary | ICD-10-CM | POA: Diagnosis not present

## 2021-06-17 DIAGNOSIS — Z8759 Personal history of other complications of pregnancy, childbirth and the puerperium: Secondary | ICD-10-CM | POA: Diagnosis not present

## 2021-06-17 DIAGNOSIS — O99342 Other mental disorders complicating pregnancy, second trimester: Secondary | ICD-10-CM

## 2021-06-17 DIAGNOSIS — Z3490 Encounter for supervision of normal pregnancy, unspecified, unspecified trimester: Secondary | ICD-10-CM

## 2021-06-17 MED ORDER — ASPIRIN EC 81 MG PO TBEC: 81.0000 mg | DELAYED_RELEASE_TABLET | Freq: Every day | ORAL | 11 refills | Status: DC

## 2021-06-17 MED ORDER — SERTRALINE HCL 50 MG PO TABS: 50.0000 mg | ORAL_TABLET | Freq: Every day | ORAL | 5 refills | Status: DC

## 2021-06-17 NOTE — Progress Notes (Signed)
?  ? ?Subjective:  ? ?Adrienne Madden is a 36 y.o. G2P1001 at 67w5dby LMP, c/w 8 week [place] seen today for [place] first obstetrical [place]   Her obstetrical history is significant for  vaginal delivery x 2 with intrapartum preeclampsia  and has Obesity; Nephrolithiasis; Preeclampsia; Umbilical hernia without obstruction and without gangrene; and Encounter for supervision of normal pregnancy on their problem list.. Patient does intend to breast feed. Pregnancy history fully reviewed. ? ?Patient reports  stress and anxiety. [place] and she and her fiance broke up during the pregnancy . ?HISTORY: ?OB History  ?Gravida Para Term Preterm AB Living  ?'2 1 1 '$ 0 0 1  ?SAB IAB Ectopic Multiple Live Births  ?0 0 0 0 1  ?  ?# Outcome Date GA Lbr Len/2nd Weight Sex Delivery Anes PTL Lv  ?2 Current           ?1 Term 07/22/17 418w0d3:06 / 04:10 7 lb 10.2 oz (3.465 kg) M Vag-Spont EPI  LIV  ?   Name: Adrienne Madden  ?   Apgar1: 4  Apgar5: 6  ? ?Past Medical History:  ?Diagnosis Date  ? Anxiety   ? Chlamydia 2011  ? Concussion 02/2016  ? History of kidney stones   ? HSV (herpes simplex virus) infection   ? IBS (irritable bowel syndrome)   ? no meds  ? Obesity   ? Polycystic ovarian syndrome   ? ?Past Surgical History:  ?Procedure Laterality Date  ? BARIATRIC SURGERY    ? BREAST ENHANCEMENT SURGERY    ? EXTRACORPOREAL SHOCK WAVE LITHOTRIPSY Right 08/10/2016  ? Procedure: RIGHT EXTRACORPOREAL SHOCK WAVE LITHOTRIPSY (ESWL);  Surgeon: BrNickie RetortMD;  Location: [facility];  Service: Urology;  Laterality: Right;  ? FINGER SURGERY Right   ? rt hand cyst  ? LAPAROSCOPIC OVARIAN CYSTECTOMY Left 10/13/2016  ? Procedure: LAPAROSCOPIC OVARIAN CYSTECTOMY - LEFT DERMOID CYST;  Surgeon: DoEmily FilbertMD;  [facility];  Service: Gynecology;  Laterality: Left;  ? LITHOTRIPSY  2015  ? WISDOM TOOTH EXTRACTION    ? ?Family  History  ?Problem Relation Age of Onset  ? Diabetes Maternal Grandmother   ? Diabetes Paternal Grandmother   ? Cancer Paternal Grandmother   ?     lung/bile duct  ? Cancer Paternal Grandfather   ?     bile duct  ? ?Social History  ? ?Tobacco Use  ? Smoking status: Never  ? Smokeless tobacco: Never  ?Vaping Use  ? Vaping Use: Never used  ?Substance Use Topics  ? Alcohol use: No  ?  Alcohol/week: 1.0 standard drink  ?  Types: 1 Standard drinks or equivalent per week  ? Drug use: No  ? ?Allergies  ?Allergen Reactions  ? Lactose Intolerance (Gi) Diarrhea  ? Hydrocodone Nausea And Vomiting  ? ?Current Outpatient Medications on File Prior to Visit  ?Medication Sig Dispense Refill  ? Prenatal Multivit-Min-Fe-FA (PRENATAL VITAMINS PO) Take 1 tablet by mouth daily.     ? ?Current Facility-Administered Medications on File Prior to Visit  ?Medication Dose Route Frequency Provider Last Rate Last Admin  ? levonorgestrel (MIRENA) 20 MCG/24HR IUD   Intrauterine Once Leftwich-Kirby, LiKathie DikeCNM      ? ? ? Indications for ASA therapy (per uptodate) ?One of the following: ?Previous pregnancy with preeclampsia, especially early onset and with  an adverse outcome Yes ?Multifetal gestation No ?Chronic hypertension No ?Type 1 or 2 diabetes mellitus No ?Chronic kidney disease No ?Autoimmune disease (antiphospholipid syndrome, systemic lupus erythematosus) No ? ?Exam  ? ?Vitals:  ? 06/17/21 1544  ?BP: 117/70  ?Pulse: 83  ?Weight: 174 lb (78.9 kg)  ? ?Fetal Heart Rate (bpm): 144 ? ?Uterus:  Fundal Height: 26 cm  ?Pelvic Exam: Perineum: no hemorrhoids, normal perineum  ? Vulva: normal external genitalia, no lesions  ? Vagina:  normal mucosa, normal discharge  ? Cervix: no lesions and normal, pap smear done.   ? Adnexa: normal adnexa and no mass, fullness, tenderness  ? Bony Pelvis: average  ?System: General: well-developed, well-nourished female in no acute distress  ? Breast:  normal appearance, no masses or tenderness  ? Skin: normal  coloration and turgor, no rashes  ? Neurologic: oriented, normal, negative, normal mood  ? Extremities: normal strength, tone, and muscle mass, ROM of all joints is normal  ? HEENT PERRLA, extraocular movement intact and sclera clear, anicteric  ? Mouth/Teeth mucous membranes moist, pharynx normal without lesions and dental hygiene good  ? Neck supple and no masses  ? Cardiovascular: regular rate and rhythm  ? Respiratory:  no respiratory distress, normal breath sounds  ? Abdomen: soft, non-tender; bowel sounds normal; no masses,  no organomegaly  ? ?  ?Assessment:  ? ?Pregnancy: G2P1001 ?Patient Active Problem List  ? Diagnosis Date Noted  ? Encounter for supervision of normal pregnancy 06/16/2021  ? Umbilical hernia without obstruction and without gangrene 09/20/2017  ? Preeclampsia 07/23/2017  ? Nephrolithiasis 01/22/2014  ? Obesity 11/23/2013  ? ?  ?Plan:  ?1. Encounter for supervision of normal pregnancy, antepartum, unspecified gravidity ?--Anticipatory guidance about next visits/weeks of pregnancy given.  ?--Reviewed prenatal records from [place], including EDD, based on sure LMP and c/w 8 week [place] ?--next visit in 2 weeks ? ?2. History of gestational hypertension ?--Preeclampsia intrapartum with last pregnancy ?--BP wnl during this pregnancy ?--Start BASA as soon as possible ? ?- aspirin EC 81 MG tablet; Take 1 tablet (81 mg total) by mouth daily. Swallow whole.  Dispense: 30 tablet; Refill: 11 ? ?3. 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 She was started on Zoloft by her provider in [place] and she reports she is taking 25 mg daily.  ?--Will increase to 50 mg after discussing with pt and refer to IBH ? ?- Ambulatory referral to [facility] ? ? ?Initial labs drawn. ?Continue prenatal vitamins. ?Reviewed genetic screening results with normal NIPS.   ?Ultrasound discussed; fetal anatomic survey: results reviewed. ?Problem list  reviewed and updated. ?The nature of [facility] with multiple MDs and other Advanced Practice Providers was explained to patient; also emphasized that residents, students are part of our team. ?Routine obstetric precautions reviewed. ?Return in about 2 weeks (around 07/01/2021). ? ? ?Fatima Blank, CNM ?06/17/21 ?5:02 PM ? ?

## 2021-06-17 NOTE — Progress Notes (Signed)
Late transfer from [facility] ?

## 2021-06-24 ENCOUNTER — Ambulatory Visit (INDEPENDENT_AMBULATORY_CARE_PROVIDER_SITE_OTHER): Payer: Medicaid Other | Admitting: Licensed Clinical Social Worker

## 2021-06-24 DIAGNOSIS — F32A Depression, unspecified: Secondary | ICD-10-CM | POA: Diagnosis not present

## 2021-06-24 DIAGNOSIS — O99342 Other mental disorders complicating pregnancy, second trimester: Secondary | ICD-10-CM | POA: Diagnosis not present

## 2021-06-25 NOTE — BH Specialist Note (Signed)
Integrated Behavioral Health via Telemedicine Visit ? ?06/25/2021 ?Adrienne Madden ?[id number] ? ?Number of [facility]ician visits: 1 ?Session Start time:  2:00pm ?Session End time: 2:18pm ?Total time in minutes:18 mins via mychart video  ? ?Referring Provider: L. Leftwich-Kirby  ?Patient/Family location: Home  ?[facility] Provider location: [facility]  ?All persons participating in visit: Adrienne Madden and LCSW A. Linton Rump  ?Types of Service: Individual psychotherapy and Video visit ? ?I connected with Adrienne Madden and/or Adrienne Madden n/a via  Telephone or Video Enabled Telemedicine Application  (Video is Caregility application) and verified that I am speaking with the correct person using two identifiers. Discussed confidentiality: Yes  ? ?I discussed the limitations of telemedicine and the availability of in person appointments.  Discussed there is a possibility of technology failure and discussed alternative modes of communication if that failure occurs. ? ?I discussed that engaging in this telemedicine visit, they consent to the provision of behavioral healthcare and the services will be billed under their insurance. ? ?Patient and/or legal guardian expressed understanding and consented to Telemedicine visit: Yes  ? ?Presenting Concerns: ?Patient and/or family reports the following symptoms/concerns: depression  ?Duration of problem: approx one month; Severity of problem: mild ? ?Patient and/or Family's Strengths/Protective Factors: ?Concrete supports in place (healthy food, safe environments, etc.) ? ?Goals Addressed: ?Patient will: ? Reduce symptoms of: depression and stress  ? Increase knowledge and/or ability of: coping skills  ? Demonstrate ability to: Increase healthy adjustment to current life circumstances ? ?Progress towards Goals: ?Ongoing ? ?Interventions: ?Interventions utilized:  Supportive Counseling and Link to [employer] ?Standardized Assessments completed:  N/A ? ?Assessment: ?Patient currently experiencing depression affecting pregnancy.  ? ?Patient may benefit from integrated behavioral health. ? ?Plan: ?Follow up with behavioral health clinician on : 07/15/2021 ?Behavioral recommendations: Engage in relaxation technique such as prenatal yoga, deep breathing and walking to prevent burnout, prioritize rest, and journal writing  ?Referral(s): [facility] (In Clinic) ? ?I discussed the assessment and treatment plan with the patient and/or parent/guardian. They were provided an opportunity to ask questions and all were answered. They agreed with the plan and demonstrated an understanding of the instructions. ?  ?They were advised to call back or seek an in-person evaluation if the symptoms worsen or if the condition fails to improve as anticipated. ? ?Lynnea Ferrier, LCSW ?

## 2021-06-30 ENCOUNTER — Other Ambulatory Visit: Payer: Self-pay

## 2021-06-30 ENCOUNTER — Ambulatory Visit (INDEPENDENT_AMBULATORY_CARE_PROVIDER_SITE_OTHER): Payer: Medicaid Other | Admitting: Obstetrics and Gynecology

## 2021-06-30 VITALS — BP 107/67 | HR 98 | Wt 178.0 lb

## 2021-06-30 DIAGNOSIS — Z3490 Encounter for supervision of normal pregnancy, unspecified, unspecified trimester: Secondary | ICD-10-CM | POA: Diagnosis not present

## 2021-06-30 DIAGNOSIS — Z23 Encounter for immunization: Secondary | ICD-10-CM | POA: Diagnosis not present

## 2021-06-30 NOTE — Progress Notes (Signed)
? ?  PRENATAL VISIT NOTE ? ?Subjective:  ?Adrienne Madden is a 36 y.o. G2P1001 at 30w4dbeing seen today for ongoing prenatal care.  She is currently monitored for the following issues for this high-risk pregnancy and has Obesity; Nephrolithiasis; Preeclampsia; Umbilical hernia without obstruction and without gangrene; and Encounter for supervision of normal pregnancy on their problem list. ? ?Patient reports no complaints.  Contractions: Not present. Vag. Bleeding: None.  Movement: Present. Denies leaking of fluid.  ?Having pain in right kidney. Significant history of kidney stones.  ? ?The following portions of the patient's history were reviewed and updated as appropriate: allergies, current medications, past family history, past medical history, past social history, past surgical history and problem list.  ? ?Objective:  ? ?Vitals:  ? 06/30/21 0829  ?BP: 107/67  ?Pulse: 98  ?Weight: 178 lb (80.7 kg)  ? ? ?Fetal Status: Fetal Heart Rate (bpm): 143 Fundal Height: 30 cm Movement: Present    ? ?General:  Alert, oriented and cooperative. Patient is in no acute distress.  ?Skin: Skin is warm and dry. No rash noted.   ?Cardiovascular: Normal heart rate noted  ?Respiratory: Normal respiratory effort, no problems with respiration noted  ?Abdomen: Soft, gravid, appropriate for gestational age.  Pain/Pressure: Absent     ?Pelvic: Cervical exam deferred        ?Extremities: Normal range of motion.  Edema: None  ?Mental Status: Normal mood and affect. Normal behavior. Normal judgment and thought content.  ? ?Assessment and Plan:  ?Pregnancy: G2P1001 at [redacted]w[redacted]d ?1. Encounter for supervision of normal pregnancy, antepartum, unspecified gravidity ? ?- 2Hr GTT w/ 1 Hr Carpenter 75 g ?- HIV antibody (with reflex) ?- CBC ?- RPR ?- Tdap vaccine greater than or equal to 7yo IM ?- Protein / creatinine ratio, urine ?- [place]FM OB COMP + 14 WK; Future ?- Urine Culture  ?- Continue BASA ?- Referral to MFM ?- Call if kidney stone pain  worsens.  ? ?Preterm labor symptoms and general obstetric precautions including but not limited to vaginal bleeding, contractions, leaking of fluid and fetal movement were reviewed in detail with the patient. ?Please refer to After Visit Summary for other counseling recommendations.  ? ?Return in about 2 weeks (around 07/14/2021). ? ?Future Appointments  ?Date Time Provider [place]?07/15/2021  8:50 AM Leftwich-Kirby, LiKathie DikeCNM [facility]  ?07/15/2021  2:00 PM FiLynnea FerrierLCSW [facility] None  ? ? ?JeNoni SaupeNP  ?

## 2021-06-30 NOTE — Addendum Note (Signed)
Addended by: Lyndal Rainbow on: 06/30/2021 09:25 AM ? ? Modules accepted: Orders ? ?

## 2021-06-30 NOTE — Progress Notes (Signed)
Tdap given.  

## 2021-07-01 LAB — 2HR GTT W 1 HR, CARPENTER, 75 G
Glucose, 1 Hr, Gest: 141 mg/dL (ref 65–179)
Glucose, 2 Hr, Gest: 57 mg/dL — ABNORMAL LOW (ref 65–152)
Glucose, Fasting, Gest: 65 mg/dL (ref 65–91)

## 2021-07-01 LAB — CBC
HCT: 35.1 % (ref 35.0–45.0)
Hemoglobin: 12.1 g/dL (ref 11.7–15.5)
MCH: 31.5 pg (ref 27.0–33.0)
MCHC: 34.5 g/dL (ref 32.0–36.0)
MCV: 91.4 fL (ref 80.0–100.0)
MPV: 9.7 fL (ref 7.5–12.5)
Platelets: 239 10*3/uL (ref 140–400)
RBC: 3.84 10*6/uL (ref 3.80–5.10)
RDW: 13 % (ref 11.0–15.0)
WBC: 9.8 10*3/uL (ref 3.8–10.8)

## 2021-07-01 LAB — URINE CULTURE
MICRO NUMBER:: 13153405
Result:: NO GROWTH
SPECIMEN QUALITY:: ADEQUATE

## 2021-07-01 LAB — PROTEIN / CREATININE RATIO, URINE
Creatinine, Urine: 92 mg/dL (ref 20–275)
Protein/Creat Ratio: 565 mg/g creat — ABNORMAL HIGH (ref 24–184)
Protein/Creatinine Ratio: 0.565 mg/mg creat — ABNORMAL HIGH (ref 0.024–0.184)
Total Protein, Urine: 52 mg/dL — ABNORMAL HIGH (ref 5–24)

## 2021-07-01 LAB — HIV ANTIBODY (ROUTINE TESTING W REFLEX): HIV 1&2 Ab, 4th Generation: NONREACTIVE

## 2021-07-01 LAB — RPR: RPR Ser Ql: NONREACTIVE

## 2021-07-15 ENCOUNTER — Encounter: Payer: Medicaid Other | Admitting: Licensed Clinical Social Worker

## 2021-07-15 ENCOUNTER — Ambulatory Visit (INDEPENDENT_AMBULATORY_CARE_PROVIDER_SITE_OTHER): Payer: Medicaid Other | Admitting: Advanced Practice Midwife

## 2021-07-15 VITALS — BP 107/67 | HR 94 | Wt 184.0 lb

## 2021-07-15 DIAGNOSIS — Z8759 Personal history of other complications of pregnancy, childbirth and the puerperium: Secondary | ICD-10-CM

## 2021-07-15 DIAGNOSIS — Z3490 Encounter for supervision of normal pregnancy, unspecified, unspecified trimester: Secondary | ICD-10-CM

## 2021-07-15 DIAGNOSIS — Z3A29 29 weeks gestation of pregnancy: Secondary | ICD-10-CM

## 2021-07-15 DIAGNOSIS — N200 Calculus of kidney: Secondary | ICD-10-CM | POA: Insufficient documentation

## 2021-07-15 DIAGNOSIS — O26833 Pregnancy related renal disease, third trimester: Secondary | ICD-10-CM

## 2021-07-15 NOTE — Progress Notes (Signed)
? ?  PRENATAL VISIT NOTE ? ?Subjective:  ?Adrienne Madden is a 36 y.o. G2P1001 at 76w5dbeing seen today for ongoing prenatal care.  She is currently monitored for the following issues for this low-risk pregnancy and has Obesity; Nephrolithiasis; Preeclampsia; Umbilical hernia without obstruction and without gangrene; and Encounter for supervision of normal pregnancy on their problem list. ? ?Patient reports no complaints.  Contractions: Not present. Vag. Bleeding: None.  Movement: Present. Denies leaking of fluid.  ? ?The following portions of the patient's history were reviewed and updated as appropriate: allergies, current medications, past family history, past medical history, past social history, past surgical history and problem list.  ? ?Objective:  ? ?Vitals:  ? 07/15/21 0846  ?BP: 107/67  ?Pulse: 94  ?Weight: 184 lb (83.5 kg)  ? ? ?Fetal Status: Fetal Heart Rate (bpm): 143   Movement: Present    ? ?General:  Alert, oriented and cooperative. Patient is in no acute distress.  ?Skin: Skin is warm and dry. No rash noted.   ?Cardiovascular: Normal heart rate noted  ?Respiratory: Normal respiratory effort, no problems with respiration noted  ?Abdomen: Soft, gravid, appropriate for gestational age.  Pain/Pressure: Absent     ?Pelvic: Cervical exam deferred        ?Extremities: Normal range of motion.  Edema: None  ?Mental Status: Normal mood and affect. Normal behavior. Normal judgment and thought content.  ? ?Assessment and Plan:  ?Pregnancy: G2P1001 at 246w5d1. Encounter for supervision of normal pregnancy, antepartum, unspecified gravidity ?--Anticipatory guidance about next visits/weeks of pregnancy given.  ? ?2. [redacted] weeks gestation of pregnancy ? ? ?3. Kidney stone complicating pregnancy, third trimester ?--Pt with hx stones, sometimes feel pain when they move around  ?--Heating pad helps when she has pain ?--Pt to notify if pain increases, needs further management ? ?4. History of gestational  hypertension ?--Elevated protein/creatinine ratio, pt thinks protein in urine may be from kidney stones, as she has had this before ?--BP grossly normal today, no s/sx of PEC, taking BASA ?  ?Preterm labor symptoms and general obstetric precautions including but not limited to vaginal bleeding, contractions, leaking of fluid and fetal movement were reviewed in detail with the patient. ?Please refer to After Visit Summary for other counseling recommendations.  ? ?No follow-ups on file. ? ?Future Appointments  ?Date Time Provider De[place]?07/15/2021  2:00 PM FiLynnea FerrierLCSW [facility] None  ?07/24/2021  1:30 PM [facility] NURSE [facility]  ?07/24/2021  1:45 PM [facility] US5 [facility]  ? ? ?LiFatima BlankCNM  ?

## 2021-07-15 NOTE — Progress Notes (Signed)
PHQ 9: Score 2 ?Gad 7: Score 2 ?

## 2021-07-22 ENCOUNTER — Inpatient Hospital Stay (HOSPITAL_COMMUNITY): Payer: Medicaid Other

## 2021-07-22 ENCOUNTER — Inpatient Hospital Stay (HOSPITAL_COMMUNITY)
Admission: AD | Admit: 2021-07-22 | Discharge: 2021-07-22 | Disposition: A | Discharge status: Home / Self Care | Payer: Medicaid Other | Attending: Obstetrics & Gynecology | Admitting: Obstetrics & Gynecology

## 2021-07-22 ENCOUNTER — Other Ambulatory Visit: Payer: Self-pay

## 2021-07-22 ENCOUNTER — Encounter (HOSPITAL_COMMUNITY): Payer: Self-pay | Admitting: Obstetrics & Gynecology

## 2021-07-22 DIAGNOSIS — N200 Calculus of kidney: Secondary | ICD-10-CM | POA: Diagnosis not present

## 2021-07-22 DIAGNOSIS — O26833 Pregnancy related renal disease, third trimester: Secondary | ICD-10-CM

## 2021-07-22 DIAGNOSIS — Z79899 Other long term (current) drug therapy: Secondary | ICD-10-CM | POA: Insufficient documentation

## 2021-07-22 DIAGNOSIS — R109 Unspecified abdominal pain: Secondary | ICD-10-CM | POA: Diagnosis not present

## 2021-07-22 DIAGNOSIS — Z3A30 30 weeks gestation of pregnancy: Secondary | ICD-10-CM | POA: Diagnosis not present

## 2021-07-22 DIAGNOSIS — O212 Late vomiting of pregnancy: Secondary | ICD-10-CM | POA: Insufficient documentation

## 2021-07-22 DIAGNOSIS — O99891 Other specified diseases and conditions complicating pregnancy: Secondary | ICD-10-CM | POA: Diagnosis present

## 2021-07-22 LAB — CBC
HCT: 33.5 % — ABNORMAL LOW (ref 36.0–46.0)
Hemoglobin: 11.6 g/dL — ABNORMAL LOW (ref 12.0–15.0)
MCH: 31.4 pg (ref 26.0–34.0)
MCHC: 34.6 g/dL (ref 30.0–36.0)
MCV: 90.8 fL (ref 80.0–100.0)
Platelets: 267 10*3/uL (ref 150–400)
RBC: 3.69 MIL/uL — ABNORMAL LOW (ref 3.87–5.11)
RDW: 12.7 % (ref 11.5–15.5)
WBC: 11.4 10*3/uL — ABNORMAL HIGH (ref 4.0–10.5)
nRBC: 0 % (ref 0.0–0.2)

## 2021-07-22 LAB — URINALYSIS, ROUTINE W REFLEX MICROSCOPIC
Bilirubin Urine: NEGATIVE
Glucose, UA: NEGATIVE mg/dL
Ketones, ur: NEGATIVE mg/dL
Nitrite: NEGATIVE
Protein, ur: 100 mg/dL — AB
RBC / HPF: >50 RBC/hpf — ABNORMAL HIGH (ref 0–5)
Specific Gravity, Urine: 1.019 (ref 1.005–1.030)
pH: 5 (ref 5.0–8.0)

## 2021-07-22 LAB — COMPREHENSIVE METABOLIC PANEL
ALT: 11 U/L (ref 0–44)
AST: 15 U/L (ref 15–41)
Albumin: 2.6 g/dL — ABNORMAL LOW (ref 3.5–5.0)
Alkaline Phosphatase: 74 U/L (ref 38–126)
Anion gap: 8 (ref 5–15)
BUN: <5 mg/dL — ABNORMAL LOW (ref 6–20)
CO2: 21 mmol/L — ABNORMAL LOW (ref 22–32)
Calcium: 8.2 mg/dL — ABNORMAL LOW (ref 8.9–10.3)
Chloride: 109 mmol/L (ref 98–111)
Creatinine, Ser: 0.39 mg/dL — ABNORMAL LOW (ref 0.44–1.00)
GFR, Estimated: >60 mL/min (ref >60)
Glucose, Bld: 85 mg/dL (ref 70–99)
Potassium: 3.7 mmol/L (ref 3.5–5.1)
Sodium: 138 mmol/L (ref 135–145)
Total Bilirubin: 0.6 mg/dL (ref 0.3–1.2)
Total Protein: 5.5 g/dL — ABNORMAL LOW (ref 6.5–8.1)

## 2021-07-22 MED ORDER — ONDANSETRON HCL 4 MG/2ML IJ SOLN
4.0000 mg | Freq: Once | INTRAMUSCULAR | Status: AC
Start: 2021-07-22 — End: 2021-07-22
  Administered 2021-07-22: 4 mg via INTRAVENOUS
  Filled 2021-07-22: qty 2

## 2021-07-22 MED ORDER — LACTATED RINGERS IV BOLUS
1000.0000 mL | Freq: Once | INTRAVENOUS | Status: AC
  Administered 2021-07-22: 1000 mL via INTRAVENOUS

## 2021-07-22 MED ORDER — TRAMADOL HCL 50 MG PO TABS: 50.0000 mg | ORAL_TABLET | Freq: Four times a day (QID) | ORAL | 0 refills | Status: AC | PRN

## 2021-07-22 MED ORDER — HYDROMORPHONE HCL 1 MG/ML IJ SOLN: 1.0000 mg | INTRAMUSCULAR | Status: DC | PRN

## 2021-07-22 MED ORDER — PROMETHAZINE HCL 25 MG PO TABS: 25.0000 mg | ORAL_TABLET | Freq: Four times a day (QID) | ORAL | 0 refills | Status: DC | PRN

## 2021-07-22 NOTE — MAU Provider Note (Signed)
?History  ?  ? ?[id number] ? ?Arrival date and time: 07/22/21 1226 ?  ? ?Chief Complaint  ?Patient presents with  ? Flank Pain  ? ? ? ?HPI ?Adrienne Madden is a 36 y.o. at 50w5dby LMP with PMHx notable for kidney stones, who presents for right flank pain. States she has long history of kidney stones with history of lithotripsy. Has not seen a urologist in over 3 years. Has had intermittent right flank pain since the beginning of this pregnancy. Current symptoms worsened since Thursday. Rates pain 10/10. Has been taking baby aspirin & tylenol without relief. Has had vomiting associated with pain - hasn't taken antiemetic. Also reports some contractions that were regular for an hour this morning. No longer having regular contractions. Denies fever, dysuria, hematuria, vaginal bleeding, or LOF. Reports good fetal movement.  ? ?OB History   ? ? Gravida  ?2  ? Para  ?1  ? Term  ?1  ? Preterm  ?0  ? AB  ?0  ? Living  ?1  ?  ? ? SAB  ?0  ? IAB  ?0  ? Ectopic  ?0  ? Multiple  ?0  ? Live Births  ?1  ?   ?  ?  ? ? ?Past Medical History:  ?Diagnosis Date  ? Anxiety   ? Chlamydia 2011  ? Concussion 02/2016  ? History of kidney stones   ? HSV (herpes simplex virus) infection   ? IBS (irritable bowel syndrome)   ? no meds  ? Obesity   ? Polycystic ovarian syndrome   ? ? ?Past Surgical History:  ?Procedure Laterality Date  ? BARIATRIC SURGERY    ? BREAST ENHANCEMENT SURGERY    ? EXTRACORPOREAL SHOCK WAVE LITHOTRIPSY Right 08/10/2016  ? Procedure: RIGHT EXTRACORPOREAL SHOCK WAVE LITHOTRIPSY (ESWL);  Surgeon: BNickie Retort MD;  Location: [facility];  Service: Urology;  Laterality: Right;  ? FINGER SURGERY Right   ? rt hand cyst  ? LAPAROSCOPIC OVARIAN CYSTECTOMY Left 10/13/2016  ? Procedure: LAPAROSCOPIC OVARIAN CYSTECTOMY - LEFT DERMOID CYST;  Surgeon: DEmily Filbert MD;  [facility];  Service: Gynecology;  Laterality: Left;  ? LITHOTRIPSY  2015  ? WISDOM TOOTH EXTRACTION    ? ? ?Family History  ?Problem Relation Age of Onset   ? Diabetes Maternal Grandmother   ? Diabetes Paternal Grandmother   ? Cancer Paternal Grandmother   ?     lung/bile duct  ? Cancer Paternal Grandfather   ?     bile duct  ? ? ?Allergies  ?Allergen Reactions  ? Lactose Intolerance (Gi) Diarrhea  ? Hydrocodone Nausea And Vomiting  ? ? ?No current facility-administered medications on file prior to encounter.  ? ?Current Outpatient Medications on File Prior to Encounter  ?Medication Sig Dispense Refill  ? aspirin EC 81 MG tablet Take 1 tablet (81 mg total) by mouth daily. Swallow whole. 30 tablet 11  ? Prenatal Multivit-Min-Fe-FA (PRENATAL VITAMINS PO) Take 1 tablet by mouth daily.     ? sertraline (ZOLOFT) 50 MG tablet Take 1 tablet (50 mg total) by mouth daily. 30 tablet 5  ? ? ? ?ROS ?Pertinent positives and negative per HPI, all others reviewed and negative ? ?Physical Exam  ? ?BP 109/62 (BP Location: Right Arm)   Pulse 86   Temp 98.6 ?F (37 ?C) (Oral)   Resp 12   Ht '5\' 3"'$  (1.6 m)   Wt 85.5 kg   LMP 12/19/2020   SpO2 98%  BMI 33.37 kg/m?  ? ?Patient Vitals for the past 24 hrs: ? BP Temp Temp src Pulse Resp SpO2 Height Weight  ?07/22/21 1610 109/62 98.6 ?F (37 ?C) Oral 86 12 -- -- --  ?07/22/21 1400 -- -- -- -- -- 98 % -- --  ?07/22/21 1355 -- -- -- -- -- 95 % -- --  ?07/22/21 1350 -- -- -- -- -- 95 % -- --  ?07/22/21 1346 (!) 115/43 -- -- 90 -- -- -- --  ?07/22/21 1345 -- -- -- -- -- 96 % -- --  ?07/22/21 1340 -- -- -- -- -- 95 % -- --  ?07/22/21 1335 -- -- -- -- -- 97 % -- --  ?07/22/21 1330 (!) 106/56 -- -- 84 -- 94 % -- --  ?07/22/21 1325 -- -- -- -- -- 96 % -- --  ?07/22/21 1320 -- -- -- -- -- 98 % -- --  ?07/22/21 1315 (!) 103/57 -- -- 79 -- 95 % -- --  ?07/22/21 1310 -- -- -- -- -- 95 % -- --  ?07/22/21 1308 114/67 -- -- 77 -- -- -- --  ?07/22/21 1243 121/62 98.1 ?F (36.7 ?C) Oral 85 18 99 % -- --  ?07/22/21 1237 -- -- -- -- -- -- '5\' 3"'$  (1.6 m) 85.5 kg  ? ? ?Physical Exam ?Vitals and nursing note reviewed. Exam conducted with a chaperone present.   ?Constitutional:   ?   General: She is not in acute distress. ?   Appearance: Normal appearance. She is not ill-appearing or toxic-appearing.  ?Eyes:  ?   Conjunctiva/sclera: Conjunctivae normal.  ?   Pupils: Pupils are equal, round, and reactive to light.  ?Pulmonary:  ?   Effort: Pulmonary effort is normal. No respiratory distress.  ?Abdominal:  ?   Tenderness: There is no right CVA tenderness or left CVA tenderness.  ?Skin: ?   General: Skin is warm and dry.  ?Neurological:  ?   General: No focal deficit present.  ?   Mental Status: She is alert.  ?Psychiatric:     ?   Mood and Affect: Mood normal.     ?   Behavior: Behavior normal.  ?  ? ?Cervical Exam ?Dilation: Closed ?Effacement (%): Thick ?Cervical Position: Posterior ?Exam by:: Jorje Guild NP ? ?FHT ?Baseline 135, moderate variability, 15x15 accels, no decels ?Toco: none ?Cat: 1 ? ?Labs ?Results for orders placed or performed during the hospital encounter of 07/22/21 (from the past 24 hour(s))  ?Urinalysis, Routine w reflex microscopic Urine, Clean Catch     Status: Abnormal  ? Collection Time: 07/22/21  1:37 PM  ?Result Value Ref Range  ? Color, Urine YELLOW YELLOW  ? APPearance HAZY (A) CLEAR  ? Specific Gravity, Urine 1.019 1.005 - 1.030  ? pH 5.0 5.0 - 8.0  ? Glucose, UA NEGATIVE NEGATIVE mg/dL  ? Hgb urine dipstick MODERATE (A) NEGATIVE  ? Bilirubin Urine NEGATIVE NEGATIVE  ? Ketones, ur NEGATIVE NEGATIVE mg/dL  ? Protein, ur 100 (A) NEGATIVE mg/dL  ? Nitrite NEGATIVE NEGATIVE  ? Leukocytes,Ua SMALL (A) NEGATIVE  ? RBC / HPF >50 (H) 0 - 5 RBC/hpf  ? WBC, UA 11-20 0 - 5 WBC/hpf  ? Bacteria, UA RARE (A) NONE SEEN  ? Squamous Epithelial / LPF 6-10 0 - 5  ? Mucus PRESENT   ? Ca Oxalate Crys, UA PRESENT   ?CBC     Status: Abnormal  ? Collection Time: 07/22/21  2:22 PM  ?Result Value Ref  Range  ? WBC 11.4 (H) 4.0 - 10.5 K/uL  ? RBC 3.69 (L) 3.87 - 5.11 MIL/uL  ? Hemoglobin 11.6 (L) 12.0 - 15.0 g/dL  ? HCT 33.5 (L) 36.0 - 46.0 %  ? MCV 90.8 80.0 - 100.0 fL   ? MCH 31.4 26.0 - 34.0 pg  ? MCHC 34.6 30.0 - 36.0 g/dL  ? RDW 12.7 11.5 - 15.5 %  ? Platelets 267 150 - 400 K/uL  ? nRBC 0.0 0.0 - 0.2 %  ?Comprehensive metabolic panel     Status: Abnormal  ? Collection Time: 07/22/21  2:22 PM  ?Result Value Ref Range  ? Sodium 138 135 - 145 mmol/L  ? Potassium 3.7 3.5 - 5.1 mmol/L  ? Chloride 109 98 - 111 mmol/L  ? CO2 21 (L) 22 - 32 mmol/L  ? Glucose, Bld 85 70 - 99 mg/dL  ? BUN <5 (L) 6 - 20 mg/dL  ? Creatinine, Ser 0.39 (L) 0.44 - 1.00 mg/dL  ? Calcium 8.2 (L) 8.9 - 10.3 mg/dL  ? Total Protein 5.5 (L) 6.5 - 8.1 g/dL  ? Albumin 2.6 (L) 3.5 - 5.0 g/dL  ? AST 15 15 - 41 U/L  ? ALT 11 0 - 44 U/L  ? Alkaline Phosphatase 74 38 - 126 U/L  ? Total Bilirubin 0.6 0.3 - 1.2 mg/dL  ? GFR, Estimated >60 >60 mL/min  ? Anion gap 8 5 - 15  ? ? ?Imaging ?US RENAL ? ?Result Date: 07/22/2021 ?CLINICAL DATA:  Right flank pain. Electronic records indicates pregnant patient is EDD 09/25/2021 EXAM: RENAL / URINARY TRACT ULTRASOUND COMPLETE COMPARISON:  CT 08/03/2016 FINDINGS: Right Kidney: Renal measurements: 12.6 x 6.7 x 7.1 cm = volume: 314 mL. Slight calyceal dilatation without frank hydronephrosis. Shadowing stone in the renal pelvis measures 11 mm. Normal parenchymal echogenicity. No focal lesion. No perinephric fluid collection. Left Kidney: Renal measurements: 13.5 x 6.2 x 5 cm = volume: 216 mL. Normal parenchymal echogenicity. No hydronephrosis. No visualized stone or focal lesion. Bladder: Partially distended with bladder volume of 134 cc. Appears normal for degree of bladder distention. No postvoid residual. Other: Gravid uterus noted but not interrogated. IMPRESSION: 1. Stone in the right renal pelvis measures 11 mm. Mild right caliceal dilatation without frank hydronephrosis may be related to pregnancy or intermittent obstruction secondary to stone. 2. Normal sonographic appearance of the left kidney. Electronically Signed   By: Keith Rake M.D.   On: 07/22/2021 15:06   ? ?MAU  Course  ?Procedures ?Lab Orders    ?     OB Urine Culture    ?     Urinalysis, Routine w reflex microscopic Urine, Clean Catch    ?     CBC    ?     Comprehensive metabolic panel    ?Meds ordered this encounter  ?M

## 2021-07-22 NOTE — MAU Note (Signed)
Adrienne Madden is a 36 y.o. at 20w5dhere in MAU reporting: ctxs every 5-10 minutes thst began this morning @ 0800.  Denies VB or LOF.  Reports has kidney stones on right side, not taking any meds.  Reports has N/V, and has vomited 15x in 24 hours. States unable to keep anything down. ? ?Onset of complaint: today ?Pain score: 5/10 ctxs & 10/10 right flank & side ?Vitals:  ? 07/22/21 1243  ?BP: 121/62  ?Pulse: 85  ?Resp: 18  ?Temp: 98.1 ?F (36.7 ?C)  ?SpO2: 99%  ?   ?FHT: 133 bpm w/ +FM ?Lab orders placed from triage:   UA ?

## 2021-07-23 LAB — CULTURE, OB URINE
Culture: <10000 — AB
Special Requests: NORMAL

## 2021-07-24 ENCOUNTER — Ambulatory Visit: Payer: Medicaid Other | Attending: Obstetrics and Gynecology

## 2021-07-24 ENCOUNTER — Other Ambulatory Visit: Payer: Self-pay

## 2021-07-29 ENCOUNTER — Encounter: Payer: Self-pay | Admitting: Advanced Practice Midwife

## 2021-08-01 ENCOUNTER — Encounter: Payer: Self-pay | Admitting: Advanced Practice Midwife

## 2021-08-05 ENCOUNTER — Ambulatory Visit (INDEPENDENT_AMBULATORY_CARE_PROVIDER_SITE_OTHER): Payer: Medicaid Other

## 2021-08-05 VITALS — BP 104/71 | HR 101 | Wt 190.0 lb

## 2021-08-05 DIAGNOSIS — Z3A32 32 weeks gestation of pregnancy: Secondary | ICD-10-CM

## 2021-08-05 DIAGNOSIS — N200 Calculus of kidney: Secondary | ICD-10-CM

## 2021-08-05 DIAGNOSIS — O26833 Pregnancy related renal disease, third trimester: Secondary | ICD-10-CM

## 2021-08-05 DIAGNOSIS — Z3490 Encounter for supervision of normal pregnancy, unspecified, unspecified trimester: Secondary | ICD-10-CM

## 2021-08-05 DIAGNOSIS — Z8759 Personal history of other complications of pregnancy, childbirth and the puerperium: Secondary | ICD-10-CM

## 2021-08-05 MED ORDER — TRAMADOL HCL 50 MG PO TABS: 50.0000 mg | ORAL_TABLET | Freq: Four times a day (QID) | ORAL | 0 refills | Status: DC | PRN

## 2021-08-05 NOTE — Progress Notes (Signed)
? ?  PRENATAL VISIT NOTE ? ?Subjective:  ?Adrienne Madden is a 36 y.o. G2P1001 at 12w5dbeing seen today for ongoing prenatal care.  She is currently monitored for the following issues for this low-risk pregnancy and has Obesity; Nephrolithiasis; Preeclampsia; Umbilical hernia without obstruction and without gangrene; Encounter for supervision of normal pregnancy; and Kidney stone complicating pregnancy, third trimester on their problem list. ? ?Patient reports no complaints.  Contractions: Not present. Vag. Bleeding: None.  Movement: Present. Denies leaking of fluid.  ? ?The following portions of the patient's history were reviewed and updated as appropriate: allergies, current medications, past family history, past medical history, past social history, past surgical history and problem list.  ? ?Objective:  ? ?Vitals:  ? 08/05/21 0857  ?BP: 104/71  ?Pulse: (!) 101  ?Weight: 190 lb (86.2 kg)  ? ? ?Fetal Status: Fetal Heart Rate (bpm): 140 Fundal Height: 34 cm Movement: Present    ? ?General:  Alert, oriented and cooperative. Patient is in no acute distress.  ?Skin: Skin is warm and dry. No rash noted.   ?Cardiovascular: Normal heart rate noted  ?Respiratory: Normal respiratory effort, no problems with respiration noted  ?Abdomen: Soft, gravid, appropriate for gestational age.  Pain/Pressure: Absent     ?Pelvic: Cervical exam deferred        ?Extremities: Normal range of motion.  Edema: None  ?Mental Status: Normal mood and affect. Normal behavior. Normal judgment and thought content.  ? ?Assessment and Plan:  ?Pregnancy: G2P1001 at 381w5d1. Encounter for supervision of normal pregnancy, antepartum, unspecified gravidity ?- Routine OB. Doing well, no concerns ?- Anticipatory guidance for upcoming appointments provided ? ?2. [redacted] weeks gestation of pregnancy ?- Endorses active fetal movement ?- FH appropriate ? ?3. Kidney stone complicating pregnancy, third trimester ?- Seen in MAU on 4/11 and dx'd with [id number] ?-  No pain today, but pain does come and go ?- Will refill tramadol ? ?4. History of gestational hypertension ?- BP normotensive. Asymptomatic ?- Continue bASA ? ?Preterm labor symptoms and general obstetric precautions including but not limited to vaginal bleeding, contractions, leaking of fluid and fetal movement were reviewed in detail with the patient. ?Please refer to After Visit Summary for other counseling recommendations.  ? ?Return in about 2 weeks (around 08/19/2021). ? ?Future Appointments  ?Date Time Provider DepHornbeak5/08/2021  7:30 AM [facility] NURSE [facility]  ?08/15/2021  7:45 AM [facility] US4 [facility]5/12/2021  9:50 AM SimMoshe CiproanEartha InchNM [facility]  ? ? ?DanRenee HarderNM ? ?

## 2021-08-13 ENCOUNTER — Encounter (HOSPITAL_COMMUNITY): Payer: Self-pay | Admitting: Obstetrics & Gynecology

## 2021-08-13 ENCOUNTER — Inpatient Hospital Stay (HOSPITAL_COMMUNITY): Payer: Medicaid Other

## 2021-08-13 ENCOUNTER — Inpatient Hospital Stay (HOSPITAL_COMMUNITY)
Admission: AD | Admit: 2021-08-13 | Discharge: 2021-08-13 | Disposition: A | Discharge status: Home / Self Care | Payer: Medicaid Other | Attending: Obstetrics & Gynecology | Admitting: Obstetrics & Gynecology

## 2021-08-13 DIAGNOSIS — N200 Calculus of kidney: Secondary | ICD-10-CM | POA: Diagnosis not present

## 2021-08-13 DIAGNOSIS — O4700 False labor before 37 completed weeks of gestation, unspecified trimester: Secondary | ICD-10-CM

## 2021-08-13 DIAGNOSIS — O26833 Pregnancy related renal disease, third trimester: Secondary | ICD-10-CM | POA: Insufficient documentation

## 2021-08-13 DIAGNOSIS — R197 Diarrhea, unspecified: Secondary | ICD-10-CM | POA: Diagnosis present

## 2021-08-13 DIAGNOSIS — Z3A33 33 weeks gestation of pregnancy: Secondary | ICD-10-CM | POA: Diagnosis not present

## 2021-08-13 DIAGNOSIS — N3001 Acute cystitis with hematuria: Secondary | ICD-10-CM

## 2021-08-13 DIAGNOSIS — Z3A34 34 weeks gestation of pregnancy: Secondary | ICD-10-CM | POA: Insufficient documentation

## 2021-08-13 HISTORY — DX: Other specified diseases and conditions complicating pregnancy: N20.0

## 2021-08-13 HISTORY — DX: Other specified diseases and conditions complicating pregnancy: O99.891

## 2021-08-13 LAB — BASIC METABOLIC PANEL
Anion gap: 7 (ref 5–15)
BUN: 5 mg/dL — ABNORMAL LOW (ref 6–20)
CO2: 21 mmol/L — ABNORMAL LOW (ref 22–32)
Calcium: 8.5 mg/dL — ABNORMAL LOW (ref 8.9–10.3)
Chloride: 109 mmol/L (ref 98–111)
Creatinine, Ser: 0.4 mg/dL — ABNORMAL LOW (ref 0.44–1.00)
GFR, Estimated: >60 mL/min (ref >60)
Glucose, Bld: 77 mg/dL (ref 70–99)
Potassium: 3.5 mmol/L (ref 3.5–5.1)
Sodium: 137 mmol/L (ref 135–145)

## 2021-08-13 LAB — URINALYSIS, ROUTINE W REFLEX MICROSCOPIC
Bilirubin Urine: NEGATIVE
Glucose, UA: NEGATIVE mg/dL
Ketones, ur: NEGATIVE mg/dL
Nitrite: NEGATIVE
Protein, ur: 30 mg/dL — AB
RBC / HPF: >50 RBC/hpf — ABNORMAL HIGH (ref 0–5)
Specific Gravity, Urine: 1.017 (ref 1.005–1.030)
WBC, UA: >50 WBC/hpf — ABNORMAL HIGH (ref 0–5)
pH: 6 (ref 5.0–8.0)

## 2021-08-13 LAB — CBC WITH DIFFERENTIAL/PLATELET
Abs Immature Granulocytes: 0.11 10*3/uL — ABNORMAL HIGH (ref 0.00–0.07)
Basophils Absolute: 0 10*3/uL (ref 0.0–0.1)
Basophils Relative: 0 %
Eosinophils Absolute: 0.1 10*3/uL (ref 0.0–0.5)
Eosinophils Relative: 1 %
HCT: 34.4 % — ABNORMAL LOW (ref 36.0–46.0)
Hemoglobin: 11.7 g/dL — ABNORMAL LOW (ref 12.0–15.0)
Immature Granulocytes: 1 %
Lymphocytes Relative: 32 %
Lymphs Abs: 3.7 10*3/uL (ref 0.7–4.0)
MCH: 30.4 pg (ref 26.0–34.0)
MCHC: 34 g/dL (ref 30.0–36.0)
MCV: 89.4 fL (ref 80.0–100.0)
Monocytes Absolute: 0.6 10*3/uL (ref 0.1–1.0)
Monocytes Relative: 5 %
Neutro Abs: 7.1 10*3/uL (ref 1.7–7.7)
Neutrophils Relative %: 61 %
Platelets: 229 10*3/uL (ref 150–400)
RBC: 3.85 MIL/uL — ABNORMAL LOW (ref 3.87–5.11)
RDW: 12.5 % (ref 11.5–15.5)
WBC: 11.7 10*3/uL — ABNORMAL HIGH (ref 4.0–10.5)
nRBC: 0 % (ref 0.0–0.2)

## 2021-08-13 LAB — FETAL FIBRONECTIN: Fetal Fibronectin: NEGATIVE

## 2021-08-13 MED ORDER — SODIUM CHLORIDE 0.9 % IV SOLN
2.0000 g | Freq: Once | INTRAVENOUS | Status: AC
  Administered 2021-08-13: 2 g via INTRAVENOUS
  Filled 2021-08-13: qty 20

## 2021-08-13 MED ORDER — SODIUM CHLORIDE 0.9 % IV SOLN: Freq: Once | INTRAVENOUS | Status: AC

## 2021-08-13 MED ORDER — NIFEDIPINE 10 MG PO CAPS: 10.0000 mg | ORAL_CAPSULE | ORAL | Status: DC | PRN

## 2021-08-13 MED ORDER — CEFADROXIL 500 MG PO CAPS: 500.0000 mg | ORAL_CAPSULE | Freq: Two times a day (BID) | ORAL | 0 refills | Status: AC

## 2021-08-13 NOTE — MAU Note (Signed)
Patient being discharged by the provider. Discharge instructions reviewed. Patient verbalized understanding. Patient left ambulatory without distress.  ?

## 2021-08-13 NOTE — MAU Provider Note (Signed)
Chief Complaint:  Contractions and Diarrhea ? ? Event Date/Time  ? First Provider Initiated Contact with Patient 08/13/21 2017   ?  ?HPI: Adrienne Madden is a 36 y.o. G2P1001 at 79w6dho presents to maternity admissions reporting Diarrhea, right flank pain, and preterm contractions. Diarrhea started Sunday night, no sick contacts or suspect food.  States has known kidney stones on right.  .Marland Kitchen?She reports good fetal movement, denies LOF, vaginal bleeding, vaginal itching/burning, h/a, dizziness, n/v, constipation or fever/chills.  . ? ?Diarrhea  ?This is a new problem. The current episode started in the past 7 days. The problem occurs more than 10 times per day. The problem has been unchanged. Associated symptoms include abdominal pain. Pertinent negatives include no bloating, chills, fever, URI or vomiting. Nothing aggravates the symptoms. She has tried nothing for the symptoms.  ? ?RN note: ?Adrienne Lisais a 36y.o. at 360w6dere in MAU reporting: Began having irregular contractions yesterday.  ?Yesterday she began having diarrhea and has had 15 episodes since then.  ?Reports she has a kidney stone and it has been causing flank pain that began 2 months ago.  ?+FM ?Denies vaginal bleeding or leaking of fluid. Reports loosing her mucous plug for three days.  ?Onset of complaint: yesterday ?Pain score: 6/10 ctx. 8/10 flank  ? ?Past Medical History: ?Past Medical History:  ?Diagnosis Date  ? Anxiety   ? Chlamydia 2011  ? Concussion 02/2016  ? History of kidney stones   ? HSV (herpes simplex virus) infection   ? IBS (irritable bowel syndrome)   ? no meds  ? Kidney stone complicating pregnancy   ? Obesity   ? Polycystic ovarian syndrome   ? ? ?Past obstetric history: ?OB History  ?Gravida Para Term Preterm AB Living  ?'2 1 1 '$ 0 0 1  ?SAB IAB Ectopic Multiple Live Births  ?0 0 0 0 1  ?  ?# Outcome Date GA Lbr Len/2nd Weight Sex Delivery Anes PTL Lv  ?2 Current           ?1 Term 07/22/17 4030w0d:06 / 04:10 3465 g M  Vag-Spont EPI  LIV  ? ? ?Past Surgical History: ?Past Surgical History:  ?Procedure Laterality Date  ? BARIATRIC SURGERY    ? BREAST ENHANCEMENT SURGERY    ? EXTRACORPOREAL SHOCK WAVE LITHOTRIPSY Right 08/10/2016  ? Procedure: RIGHT EXTRACORPOREAL SHOCK WAVE LITHOTRIPSY (ESWL);  Surgeon: BriNickie RetortD;  Location: [facility];  Service: Urology;  Laterality: Right;  ? FINGER SURGERY Right   ? rt hand cyst  ? LAPAROSCOPIC OVARIAN CYSTECTOMY Left 10/13/2016  ? Procedure: LAPAROSCOPIC OVARIAN CYSTECTOMY - LEFT DERMOID CYST;  Surgeon: DovEmily FilbertD;  Location: [facility];  Service: Gynecology;  Laterality: Left;  ? LITHOTRIPSY  2015  ? WISDOM TOOTH EXTRACTION    ? ? ?Family History: ?Family History  ?Problem Relation Age of Onset  ? Diabetes Maternal Grandmother   ? Diabetes Paternal Grandmother   ? Cancer Paternal Grandmother   ?     lung/bile duct  ? Cancer Paternal Grandfather   ?     bile duct  ? ? ?Social History: ?Social History  ? ?Tobacco Use  ? Smoking status: Never  ? Smokeless tobacco: Never  ?Vaping Use  ? Vaping Use: Never used  ?Substance Use Topics  ? Alcohol use: No  ?  Alcohol/week: 1.0 standard drink  ?  Types: 1 Standard drinks or equivalent per week  ? Drug use: No  ? ? ?  Allergies:  ?Allergies  ?Allergen Reactions  ? Lactose Intolerance (Gi) Diarrhea  ? Hydrocodone Nausea And Vomiting  ? ? ?Meds:  ?Medications Prior to Admission  ?Medication Sig Dispense Refill Last Dose  ? Prenatal Multivit-Min-Fe-FA (PRENATAL VITAMINS PO) Take 1 tablet by mouth daily.    08/13/2021  ? promethazine (PHENERGAN) 25 MG tablet Take 1 tablet (25 mg total) by mouth every 6 (six) hours as needed for nausea or vomiting. 30 tablet 0 08/13/2021  ? sertraline (ZOLOFT) 50 MG tablet Take 1 tablet (50 mg total) by mouth daily. 30 tablet 5 08/13/2021  ? traMADol (ULTRAM) 50 MG tablet Take 1 tablet (50 mg total) by mouth every 6 (six) hours as needed. 20 tablet 0 08/13/2021  ? aspirin EC 81 MG tablet Take 1 tablet (81 mg total) by mouth  daily. Swallow whole. 30 tablet 11 More than a month  ? ? ?I have reviewed patient's Past Medical Hx, Surgical Hx, Family Hx, Social Hx, medications and allergies.  ? ?ROS:  ?Review of Systems  ?Constitutional:  Negative for chills and fever.  ?Gastrointestinal:  Positive for abdominal pain and diarrhea. Negative for bloating and vomiting.  ?Other systems negative ? ?Physical Exam  ?Patient Vitals for the past 24 hrs: ? BP Temp Temp src Pulse Resp SpO2 Height Weight  ?08/13/21 1918 125/69 98.9 ?F (37.2 ?C) Oral 95 13 100 % '5\' 3"'$  (1.6 m) 86.3 kg  ? ?Constitutional: Well-developed, well-nourished female in no acute distress.  ?Cardiovascular: normal rate and rhythm ?Respiratory: normal effort, clear to auscultation bilaterally ?GI: Abd soft, non-tender, gravid appropriate for gestational age.   No rebound or guarding. ?MS: Extremities nontender, no edema, normal ROM ?Neurologic: Alert and oriented x 4.  ?GU: Neg CVAT. ? ?PELVIC EXAM:  Dilation: Fingertip ?Effacement (%): 30 ?Station: Ballotable ?Exam by:: Lelan Pons, CNM ? ? ?FHT:  Baseline 140 , moderate variability, accelerations present, no decelerations ?Contractions: q 7 mins Irregular  ?  ?Labs: ?Results for orders placed or performed during the hospital encounter of 08/13/21 (from the past 24 hour(s))  ?Urinalysis, Routine w reflex microscopic Urine, Clean Catch     Status: Abnormal  ? Collection Time: 08/13/21  7:25 PM  ?Result Value Ref Range  ? Color, Urine YELLOW YELLOW  ? APPearance CLOUDY (A) CLEAR  ? Specific Gravity, Urine 1.017 1.005 - 1.030  ? pH 6.0 5.0 - 8.0  ? Glucose, UA NEGATIVE NEGATIVE mg/dL  ? Hgb urine dipstick MODERATE (A) NEGATIVE  ? Bilirubin Urine NEGATIVE NEGATIVE  ? Ketones, ur NEGATIVE NEGATIVE mg/dL  ? Protein, ur 30 (A) NEGATIVE mg/dL  ? Nitrite NEGATIVE NEGATIVE  ? Leukocytes,Ua LARGE (A) NEGATIVE  ? RBC / HPF >50 (H) 0 - 5 RBC/hpf  ? WBC, UA >50 (H) 0 - 5 WBC/hpf  ? Bacteria, UA MANY (A) NONE SEEN  ? Squamous Epithelial / LPF 11-20 0 -  5  ? Mucus PRESENT   ? Ca Oxalate Crys, UA PRESENT   ? ?  ? ?Imaging:  ?US RENAL ? ?Result Date: 08/13/2021 ?CLINICAL DATA:  Flank pain with known right renal pelvis stone. EXAM: RENAL / URINARY TRACT ULTRASOUND COMPLETE COMPARISON:  Similar study of 07/22/2021 FINDINGS: Right Kidney: Renal measurements: 11.3 x 5.4 x 5.6 cm = volume: 179.9 mL, previously 314 mL. Echogenicity within normal limits. No mass is seen. Minimal hydronephrosis is again shown. Again noted is a 1.6 cm stone in the renal pelvis and a 7 mm nonobstructive caliceal stone in the inferior pole. Left Kidney:  Renal measurements: 13.5 x 6.3 x 6.0 cm = volume: 264.1 mL, previously 216 mL. Echogenicity within normal limits. No mass, stones or hydronephrosis visualized. Bladder: Contracted and obscured by bowel gas at the time of imaging. Other: Gravid uterus again noted but not evaluated. The fetal head is vertex presentation. IMPRESSION: 1. 1.6 cm right renal pelvis stone with minimal hydronephrosis, unchanged. Again could be related to pyelocaliectasis of pregnancy or intermittent obstruction by the renal pelvis stone. Similar findings previously. 2. Nonobstructive 7 mm caliceal stone in the inferior pole right kidney. 3. Normal bilateral cortical thickness and echogenicity with no significant findings on the left. 4. Contracted bladder, obscured by bowel gas at this time. Electronically Signed   By: Telford Nab M.D.   On: 08/13/2021 21:51   ? ?MAU Course/MDM: ?I have ordered labs and reviewed results. FFn is negative.  Other labs normal except UA indicates infection, sent to culture. ?NST reviewed ?Consult Dr Roselie Awkward with presentation, exam findings and test results. Since she is afebrile and no overt leukocytosis, will not need to admit for pyelo.  ?Treatments in MAU included Rocephin 2gm, IV fluids ?Contractions improved with hydration. Procardia not given due to neg FFn and patient not feeling contractions. Marland Kitchen   ?Discussed recommendation to see  Urology. Pt did not because she knew they couldn't do lithotrypsy during pregnancy.  I advised seeing them anyway just to have them evaluate her history and USs for planning in the event her situation becomes

## 2021-08-13 NOTE — MAU Note (Signed)
..  Adrienne Madden is a 36 y.o. at 39w6dhere in MAU reporting: Began having irregular contractions yesterday.  ?Yesterday she began having diarrhea and has had 15 episodes since then.  ?Reports she has a kidney stone and it has been causing flank pain that began 2 months ago.  ?+FM ?Denies vaginal bleeding or leaking of fluid. Reports loosing her mucous plug for three days.  ?Onset of complaint: yesterday ?Pain score: 6/10 ctx. 8/10 flank  ?Vitals:  ? 08/13/21 1918  ?BP: 125/69  ?Pulse: 95  ?Resp: 13  ?Temp: 98.9 ?F (37.2 ?C)  ?SpO2: 100%  ?   ?FHT:143 ?Lab orders placed from triage: UA ? ?

## 2021-08-15 ENCOUNTER — Encounter: Payer: Self-pay | Admitting: *Deleted

## 2021-08-15 ENCOUNTER — Ambulatory Visit (HOSPITAL_BASED_OUTPATIENT_CLINIC_OR_DEPARTMENT_OTHER): Payer: Medicaid Other

## 2021-08-15 ENCOUNTER — Ambulatory Visit: Payer: Medicaid Other | Attending: Obstetrics and Gynecology | Admitting: *Deleted

## 2021-08-15 VITALS — BP 118/63 | HR 92

## 2021-08-15 DIAGNOSIS — O99213 Obesity complicating pregnancy, third trimester: Secondary | ICD-10-CM | POA: Insufficient documentation

## 2021-08-15 DIAGNOSIS — O26833 Pregnancy related renal disease, third trimester: Secondary | ICD-10-CM | POA: Diagnosis not present

## 2021-08-15 DIAGNOSIS — N200 Calculus of kidney: Secondary | ICD-10-CM | POA: Diagnosis not present

## 2021-08-15 DIAGNOSIS — Z3A34 34 weeks gestation of pregnancy: Secondary | ICD-10-CM | POA: Insufficient documentation

## 2021-08-15 DIAGNOSIS — O09293 Supervision of pregnancy with other poor reproductive or obstetric history, third trimester: Secondary | ICD-10-CM | POA: Diagnosis not present

## 2021-08-15 DIAGNOSIS — Z3490 Encounter for supervision of normal pregnancy, unspecified, unspecified trimester: Secondary | ICD-10-CM

## 2021-08-15 DIAGNOSIS — O3483 Maternal care for other abnormalities of pelvic organs, third trimester: Secondary | ICD-10-CM | POA: Insufficient documentation

## 2021-08-15 DIAGNOSIS — Z363 Encounter for antenatal screening for malformations: Secondary | ICD-10-CM | POA: Insufficient documentation

## 2021-08-15 DIAGNOSIS — N83201 Unspecified ovarian cyst, right side: Secondary | ICD-10-CM | POA: Diagnosis not present

## 2021-08-15 DIAGNOSIS — O2693 Pregnancy related conditions, unspecified, third trimester: Secondary | ICD-10-CM | POA: Diagnosis not present

## 2021-08-15 DIAGNOSIS — Z3689 Encounter for other specified antenatal screening: Secondary | ICD-10-CM

## 2021-08-15 LAB — CULTURE, OB URINE

## 2021-08-19 ENCOUNTER — Ambulatory Visit (INDEPENDENT_AMBULATORY_CARE_PROVIDER_SITE_OTHER): Payer: Medicaid Other

## 2021-08-19 ENCOUNTER — Encounter: Payer: Self-pay | Admitting: Advanced Practice Midwife

## 2021-08-19 ENCOUNTER — Telehealth: Payer: Self-pay | Admitting: Advanced Practice Midwife

## 2021-08-19 ENCOUNTER — Other Ambulatory Visit: Payer: Self-pay | Admitting: Advanced Practice Midwife

## 2021-08-19 VITALS — BP 110/70 | HR 96 | Wt 196.0 lb

## 2021-08-19 DIAGNOSIS — Z3490 Encounter for supervision of normal pregnancy, unspecified, unspecified trimester: Secondary | ICD-10-CM

## 2021-08-19 DIAGNOSIS — Z3A34 34 weeks gestation of pregnancy: Secondary | ICD-10-CM

## 2021-08-19 DIAGNOSIS — N200 Calculus of kidney: Secondary | ICD-10-CM

## 2021-08-19 DIAGNOSIS — F410 Panic disorder [episodic paroxysmal anxiety] without agoraphobia: Secondary | ICD-10-CM

## 2021-08-19 DIAGNOSIS — Z8759 Personal history of other complications of pregnancy, childbirth and the puerperium: Secondary | ICD-10-CM

## 2021-08-19 DIAGNOSIS — F32A Depression, unspecified: Secondary | ICD-10-CM

## 2021-08-19 DIAGNOSIS — O2343 Unspecified infection of urinary tract in pregnancy, third trimester: Secondary | ICD-10-CM

## 2021-08-19 MED ORDER — OXYCODONE-ACETAMINOPHEN 5-325 MG PO TABS: 1.0000 | ORAL_TABLET | Freq: Four times a day (QID) | ORAL | 0 refills | Status: DC | PRN

## 2021-08-19 MED ORDER — BUSPIRONE HCL 5 MG PO TABS: 5.0000 mg | ORAL_TABLET | Freq: Three times a day (TID) | ORAL | 2 refills | Status: DC | PRN

## 2021-08-19 MED ORDER — SERTRALINE HCL 100 MG PO TABS: 100.0000 mg | ORAL_TABLET | Freq: Every day | ORAL | 11 refills | Status: DC

## 2021-08-19 NOTE — Progress Notes (Signed)
? ?  PRENATAL VISIT NOTE ? ?Subjective:  ?Adrienne Madden is a 36 y.o. G2P1001 at 34w5dbeing seen today for ongoing prenatal care.  She is currently monitored for the following issues for this low-risk pregnancy and has Obesity; Nephrolithiasis; Preeclampsia; Umbilical hernia without obstruction and without gangrene; Encounter for supervision of normal pregnancy; and Kidney stone complicating pregnancy, third trimester on their problem list. ? ?Patient reports no complaints.  Contractions: Not present. Vag. Bleeding: None.  Movement: Present. Denies leaking of fluid.  ? ?The following portions of the patient's history were reviewed and updated as appropriate: allergies, current medications, past family history, past medical history, past social history, past surgical history and problem list.  ? ?Objective:  ? ?Vitals:  ? 08/19/21 0944  ?BP: 110/70  ?Pulse: 96  ?Weight: 196 lb (88.9 kg)  ? ? ?Fetal Status: Fetal Heart Rate (bpm): 141 Fundal Height: 34 cm Movement: Present    ? ?General:  Alert, oriented and cooperative. Patient is in no acute distress.  ?Skin: Skin is warm and dry. No rash noted.   ?Cardiovascular: Normal heart rate noted  ?Respiratory: Normal respiratory effort, no problems with respiration noted  ?Abdomen: Soft, gravid, appropriate for gestational age.  Pain/Pressure: Absent     ?Pelvic: Cervical exam deferred        ?Extremities: Normal range of motion.  Edema: None  ?Mental Status: Normal mood and affect. Normal behavior. Normal judgment and thought content.  ? ?Assessment and Plan:  ?Pregnancy: G2P1001 at 340w5d1. Encounter for supervision of normal pregnancy, antepartum, unspecified gravidity ?- Routine OB. Doing well ?- Seen in MAU recently for PTL and UTI. Doing better.   ?- Cultures next visit ? ?2. [redacted] weeks gestation of pregnancy ?- Endorses active fetal movement ?- FH appropriate ? ?3. History of gestational hypertension ?- BP normotensive ? ?4. Urinary tract infection in mother during  third trimester of pregnancy ?- Received IV Rocephin in MAU on 5/3. Has 2 days of PO antibiotics left ?- Asymptomatic ? ? ?Preterm labor symptoms and general obstetric precautions including but not limited to vaginal bleeding, contractions, leaking of fluid and fetal movement were reviewed in detail with the patient. ?Please refer to After Visit Summary for other counseling recommendations.  ? ?Return in about 2 weeks (around 09/02/2021). ? ?Future Appointments  ?Date Time Provider [place]?09/02/2021 10:30 AM Rasch, JeArtist PaisNP [facility]  ? ? ? ?DaRenee HarderCNM ? ?

## 2021-08-19 NOTE — Addendum Note (Signed)
Addended by: Fatima Blank A on: 08/19/2021 05:28 PM ? ? Modules accepted: Orders ? ?

## 2021-08-19 NOTE — Progress Notes (Signed)
Pt messaged provider with increased stress and anxiety related to her kidney stones and delivery plan.  She requested an increase in her Zoloft due to anxiety and panic attacks. Rx for Zoloft 100 mg daily plus Buspar 5-10 mg TID PRN sent to pt preferred pharmacy.  Pt to f/u if not improved.  ?

## 2021-08-19 NOTE — Addendum Note (Signed)
Addended by: Asencion Islam on: 08/19/2021 11:49 AM ? ? Modules accepted: Orders ? ?

## 2021-08-19 NOTE — Telephone Encounter (Signed)
I called and talked with Adrienne Madden today about her pain with kidney stones and desire for IOL and delivery early.  Per consult with Dr Damita Dunnings, no current reasons for IOL before 39 weeks, but Adrienne Madden to continue home BPs with hx of PEC, and use [employer] app to enter her home BPs; Adrienne Madden reports Tramadol is not covering her pain and she is needing the medication daily so Rx for Percocet sent to pharmacy; message sent to Dr Gertie Exon with MFM for consult about antenatal testing/delivery.  Discussed accommodations/leave from work but Adrienne Madden wants/needs to work at this time.  Adrienne Madden agrees with plan of care and will keep scheduled office appt on 09/02/21.  ?

## 2021-08-20 ENCOUNTER — Telehealth: Payer: Self-pay | Admitting: Obstetrics and Gynecology

## 2021-08-20 MED ORDER — OXYCODONE-ACETAMINOPHEN 5-325 MG PO TABS: 1.0000 | ORAL_TABLET | Freq: Four times a day (QID) | ORAL | 0 refills | Status: DC | PRN

## 2021-08-20 NOTE — Telephone Encounter (Signed)
Called by [employer] for bp 141/90 and patient report of headache. I called and spoke with the patient. She reports headache last night that has resolved. Normal fetal movements. No vision changes or new abdominal pain. Says she has a symptomatic kidney stone and thinks her bp is elevated due to that. Does have a history of preeclampsia. I advised her to present to our MAU for preeclampsia evaluation. Sounds like she is not inclined to do so. I did review with her preeclampsia precautions. ?

## 2021-08-20 NOTE — Addendum Note (Signed)
Addended by: Fatima Blank A on: 08/20/2021 05:59 PM ? ? Modules accepted: Orders ? ?

## 2021-08-25 ENCOUNTER — Other Ambulatory Visit (HOSPITAL_COMMUNITY)
Admission: RE | Admit: 2021-08-25 | Discharge: 2021-08-25 | Disposition: A | Discharge status: Home / Self Care | Payer: Medicaid Other | Source: Ambulatory Visit | Attending: Obstetrics & Gynecology | Admitting: Obstetrics & Gynecology

## 2021-08-25 ENCOUNTER — Encounter (HOSPITAL_COMMUNITY): Payer: Self-pay | Admitting: *Deleted

## 2021-08-25 ENCOUNTER — Telehealth: Payer: Self-pay | Admitting: *Deleted

## 2021-08-25 ENCOUNTER — Telehealth (HOSPITAL_COMMUNITY): Payer: Self-pay | Admitting: *Deleted

## 2021-08-25 ENCOUNTER — Ambulatory Visit (INDEPENDENT_AMBULATORY_CARE_PROVIDER_SITE_OTHER): Payer: Medicaid Other | Admitting: Obstetrics & Gynecology

## 2021-08-25 VITALS — BP 118/74 | HR 98 | Wt 191.0 lb

## 2021-08-25 DIAGNOSIS — O26833 Pregnancy related renal disease, third trimester: Secondary | ICD-10-CM

## 2021-08-25 DIAGNOSIS — O133 Gestational [pregnancy-induced] hypertension without significant proteinuria, third trimester: Secondary | ICD-10-CM | POA: Diagnosis present

## 2021-08-25 DIAGNOSIS — N200 Calculus of kidney: Secondary | ICD-10-CM

## 2021-08-25 DIAGNOSIS — O139 Gestational [pregnancy-induced] hypertension without significant proteinuria, unspecified trimester: Secondary | ICD-10-CM | POA: Insufficient documentation

## 2021-08-25 DIAGNOSIS — Z3490 Encounter for supervision of normal pregnancy, unspecified, unspecified trimester: Secondary | ICD-10-CM

## 2021-08-25 LAB — OB RESULTS CONSOLE GBS: GBS: NEGATIVE

## 2021-08-25 NOTE — Progress Notes (Signed)
? ?  PRENATAL VISIT NOTE ? ?Subjective:  ?Adrienne Madden is a 36 y.o. G2P1001 at 24w4dbeing seen today for ongoing prenatal care.  She is currently monitored for the following issues for this low-risk pregnancy and has Obesity; Nephrolithiasis; Preeclampsia; Umbilical hernia without obstruction and without gangrene; Encounter for supervision of normal pregnancy; Kidney stone complicating pregnancy, third trimester; Irritable bowel syndrome with both constipation and diarrhea; PCOS (polycystic ovarian syndrome); S/P laparoscopic sleeve gastrectomy; and Gestational HTN on their problem list. ? ?Patient reports no complaints.  Contractions: Irritability. Vag. Bleeding: None.  Movement: Present. Denies leaking of fluid.  ? ?The following portions of the patient's history were reviewed and updated as appropriate: allergies, current medications, past family history, past medical history, past social history, past surgical history and problem list.  ? ?Objective:  ? ?Vitals:  ? 08/25/21 1027  ?BP: 118/74  ?Pulse: 98  ?Weight: 191 lb (86.6 kg)  ? ? ?Fetal Status: Fetal Heart Rate (bpm): 144   Movement: Present    ? ?General:  Alert, oriented and cooperative. Patient is in no acute distress.  ?Skin: Skin is warm and dry. No rash noted.   ?Cardiovascular: Normal heart rate noted  ?Respiratory: Normal respiratory effort, no problems with respiration noted  ?Abdomen: Soft, gravid, appropriate for gestational age.  Pain/Pressure: Present     ?Pelvic: Cervical exam deferred        ?Extremities: Normal range of motion.     ?Mental Status: Normal mood and affect. Normal behavior. Normal judgment and thought content.  ? ?Assessment and Plan:  ?Pregnancy: G2P1001 at 351w4d1. Gestational hypertension, third trimester ?- Take BP daily with BRx ?- CBC ?- Comprehensive metabolic panel ?- Protein / creatinine ratio, urine ?- Culture, beta strep (group b only) ?- Cervicovaginal ancillary only( [place]) ?- [place] FETAL BPP  W/NONSTRESS; Future ?- Induction at 37 weeks--outpatient foley to be placed ?-signs and symptoms of Pre E reviewed and pt verbalizes understanding.  She had Pre E with her last pregnancy.   ? ?2. Kidney stone complicating pregnancy, third trimester ?Continue symptom management; no signs of UTI ? ?3. Encounter for supervision of normal pregnancy, antepartum, unspecified gravidity ?- CBC ?- Comprehensive metabolic panel ?- Protein / creatinine ratio, urine ?- Culture, beta strep (group b only) ?- Cervicovaginal ancillary only( [place]) ?- [place] FETAL BPP W/NONSTRESS; Future ? ?Preterm labor symptoms and general obstetric precautions including but not limited to vaginal bleeding, contractions, leaking of fluid and fetal movement were reviewed in detail with the patient. ?Please refer to After Visit Summary for other counseling recommendations.  ? ?No follow-ups on file. ? ?Future Appointments  ?Date Time Provider [place]?09/02/2021 10:30 AM Rasch, JeArtist PaisNP [facility]  ? ? ?KeSilas SacramentoMD ? ?

## 2021-08-25 NOTE — Telephone Encounter (Signed)
Left patient an urgent message that appointment will stay as scheduled on 09/02/2021 at 10:30 AM. ?

## 2021-08-25 NOTE — Telephone Encounter (Signed)
Preadmission screen  

## 2021-08-26 LAB — COMPREHENSIVE METABOLIC PANEL
AG Ratio: 1.4 (calc) (ref 1.0–2.5)
ALT: 5 U/L — ABNORMAL LOW (ref 6–29)
AST: 10 U/L (ref 10–30)
Albumin: 3.3 g/dL — ABNORMAL LOW (ref 3.6–5.1)
Alkaline phosphatase (APISO): 91 U/L (ref 31–125)
BUN/Creatinine Ratio: 16 (calc) (ref 6–22)
BUN: 7 mg/dL (ref 7–25)
CO2: 24 mmol/L (ref 20–32)
Calcium: 8.4 mg/dL — ABNORMAL LOW (ref 8.6–10.2)
Chloride: 107 mmol/L (ref 98–110)
Creat: 0.43 mg/dL — ABNORMAL LOW (ref 0.50–0.97)
Globulin: 2.3 g/dL (calc) (ref 1.9–3.7)
Glucose, Bld: 102 mg/dL — ABNORMAL HIGH (ref 65–99)
Potassium: 4 mmol/L (ref 3.5–5.3)
Sodium: 139 mmol/L (ref 135–146)
Total Bilirubin: 0.3 mg/dL (ref 0.2–1.2)
Total Protein: 5.6 g/dL — ABNORMAL LOW (ref 6.1–8.1)

## 2021-08-26 LAB — CBC
HCT: 33.7 % — ABNORMAL LOW (ref 35.0–45.0)
Hemoglobin: 11.4 g/dL — ABNORMAL LOW (ref 11.7–15.5)
MCH: 30.6 pg (ref 27.0–33.0)
MCHC: 33.8 g/dL (ref 32.0–36.0)
MCV: 90.3 fL (ref 80.0–100.0)
MPV: 9.2 fL (ref 7.5–12.5)
Platelets: 223 10*3/uL (ref 140–400)
RBC: 3.73 10*6/uL — ABNORMAL LOW (ref 3.80–5.10)
RDW: 11.9 % (ref 11.0–15.0)
WBC: 10.8 10*3/uL (ref 3.8–10.8)

## 2021-08-26 LAB — CERVICOVAGINAL ANCILLARY ONLY
Chlamydia: NEGATIVE
Comment: NEGATIVE
Comment: NORMAL
Neisseria Gonorrhea: NEGATIVE

## 2021-08-26 LAB — PROTEIN / CREATININE RATIO, URINE
Creatinine, Urine: 98 mg/dL (ref 20–275)
Protein/Creat Ratio: 378 mg/g creat — ABNORMAL HIGH (ref 24–184)
Protein/Creatinine Ratio: 0.378 mg/mg creat — ABNORMAL HIGH (ref 0.024–0.184)
Total Protein, Urine: 37 mg/dL — ABNORMAL HIGH (ref 5–24)

## 2021-08-28 ENCOUNTER — Other Ambulatory Visit: Payer: Self-pay | Admitting: Advanced Practice Midwife

## 2021-08-28 LAB — CULTURE, BETA STREP (GROUP B ONLY)
MICRO NUMBER:: 13399083
SPECIMEN QUALITY:: ADEQUATE

## 2021-09-01 ENCOUNTER — Ambulatory Visit: Payer: Medicaid Other | Admitting: *Deleted

## 2021-09-01 ENCOUNTER — Ambulatory Visit: Payer: Medicaid Other | Attending: Obstetrics & Gynecology

## 2021-09-01 ENCOUNTER — Encounter: Payer: Self-pay | Admitting: *Deleted

## 2021-09-01 VITALS — BP 117/72 | HR 97

## 2021-09-01 DIAGNOSIS — O09293 Supervision of pregnancy with other poor reproductive or obstetric history, third trimester: Secondary | ICD-10-CM | POA: Insufficient documentation

## 2021-09-01 DIAGNOSIS — N200 Calculus of kidney: Secondary | ICD-10-CM | POA: Insufficient documentation

## 2021-09-01 DIAGNOSIS — Z3490 Encounter for supervision of normal pregnancy, unspecified, unspecified trimester: Secondary | ICD-10-CM | POA: Insufficient documentation

## 2021-09-01 DIAGNOSIS — O26833 Pregnancy related renal disease, third trimester: Secondary | ICD-10-CM | POA: Insufficient documentation

## 2021-09-01 DIAGNOSIS — O99213 Obesity complicating pregnancy, third trimester: Secondary | ICD-10-CM | POA: Diagnosis not present

## 2021-09-01 DIAGNOSIS — O133 Gestational [pregnancy-induced] hypertension without significant proteinuria, third trimester: Secondary | ICD-10-CM | POA: Diagnosis not present

## 2021-09-01 DIAGNOSIS — Z3A36 36 weeks gestation of pregnancy: Secondary | ICD-10-CM | POA: Insufficient documentation

## 2021-09-02 ENCOUNTER — Other Ambulatory Visit: Payer: Self-pay | Admitting: Women's Health

## 2021-09-02 ENCOUNTER — Ambulatory Visit (INDEPENDENT_AMBULATORY_CARE_PROVIDER_SITE_OTHER): Payer: Medicaid Other | Admitting: Obstetrics and Gynecology

## 2021-09-02 ENCOUNTER — Other Ambulatory Visit: Payer: Self-pay | Admitting: Obstetrics & Gynecology

## 2021-09-02 VITALS — BP 116/78 | HR 105 | Wt 198.0 lb

## 2021-09-02 DIAGNOSIS — O133 Gestational [pregnancy-induced] hypertension without significant proteinuria, third trimester: Secondary | ICD-10-CM

## 2021-09-02 DIAGNOSIS — Z3A36 36 weeks gestation of pregnancy: Secondary | ICD-10-CM

## 2021-09-02 DIAGNOSIS — O09893 Supervision of other high risk pregnancies, third trimester: Secondary | ICD-10-CM

## 2021-09-02 DIAGNOSIS — Z3490 Encounter for supervision of normal pregnancy, unspecified, unspecified trimester: Secondary | ICD-10-CM

## 2021-09-02 NOTE — Progress Notes (Signed)
   PRENATAL VISIT NOTE  Subjective:  Adrienne Madden is a 36 y.o. G2P1001 at 51w5dbeing seen today for ongoing prenatal care.  She is currently monitored for the following issues for this high-risk pregnancy and has Obesity; Nephrolithiasis; Umbilical hernia without obstruction and without gangrene; Encounter for supervision of normal pregnancy; Kidney stone complicating pregnancy, third trimester; Irritable bowel syndrome with both constipation and diarrhea; PCOS (polycystic ovarian syndrome); S/P laparoscopic sleeve gastrectomy; and Gestational HTN on their problem list.  Patient reports no complaints.  Contractions: Irritability. Vag. Bleeding: None.  Movement: Present. Denies leaking of fluid.   The following portions of the patient's history were reviewed and updated as appropriate: allergies, current medications, past family history, past medical history, past social history, past surgical history and problem list.   Objective:   Vitals:   09/02/21 1018  BP: 116/78  Pulse: (!) 105  Weight: 198 lb (89.8 kg)    Fetal Status: Fetal Heart Rate (bpm): 159   Movement: Present     General:  Alert, oriented and cooperative. Patient is in no acute distress.  Skin: Skin is warm and dry. No rash noted.   Cardiovascular: Normal heart rate noted  Respiratory: Normal respiratory effort, no problems with respiration noted  Abdomen: Soft, gravid, appropriate for gestational age.  Pain/Pressure: Absent     Pelvic: Cervical exam deferred        Extremities: Normal range of motion.  Edema: None  Mental Status: Normal mood and affect. Normal behavior. Normal judgment and thought content.   Assessment and Plan:  Pregnancy: G2P1001 at 324w5d 1. Encounter for supervision of normal pregnancy, antepartum, unspecified gravidity  GBS negative  Induction scheduled for Thursday Plan IUD PP BP good today.    Preterm labor symptoms and general obstetric precautions including but not limited to  vaginal bleeding, contractions, leaking of fluid and fetal movement were reviewed in detail with the patient. Please refer to After Visit Summary for other counseling recommendations.   No follow-ups on file.  Future Appointments  Date Time Provider [place]5/25/2023  6:45 AM [facility] None    JeNoni SaupeNP

## 2021-09-03 ENCOUNTER — Other Ambulatory Visit: Payer: Self-pay | Admitting: Advanced Practice Midwife

## 2021-09-03 NOTE — Addendum Note (Signed)
Addended by: Guss Bunde on: 09/03/2021 11:34 AM   Modules accepted: Orders

## 2021-09-04 ENCOUNTER — Encounter (HOSPITAL_COMMUNITY): Payer: Self-pay | Admitting: Obstetrics & Gynecology

## 2021-09-04 ENCOUNTER — Inpatient Hospital Stay (HOSPITAL_COMMUNITY)
Admission: AD | Admit: 2021-09-04 | Discharge: 2021-09-07 | DRG: 807 | Disposition: A | Discharge status: Home / Self Care | Payer: Medicaid Other | Attending: Family Medicine | Admitting: Family Medicine

## 2021-09-04 ENCOUNTER — Telehealth: Payer: Self-pay

## 2021-09-04 ENCOUNTER — Inpatient Hospital Stay (HOSPITAL_COMMUNITY): Payer: Medicaid Other

## 2021-09-04 DIAGNOSIS — K582 Mixed irritable bowel syndrome: Secondary | ICD-10-CM | POA: Diagnosis present

## 2021-09-04 DIAGNOSIS — O139 Gestational [pregnancy-induced] hypertension without significant proteinuria, unspecified trimester: Secondary | ICD-10-CM | POA: Diagnosis present

## 2021-09-04 DIAGNOSIS — Z3A37 37 weeks gestation of pregnancy: Secondary | ICD-10-CM | POA: Diagnosis not present

## 2021-09-04 DIAGNOSIS — O99844 Bariatric surgery status complicating childbirth: Secondary | ICD-10-CM | POA: Diagnosis present

## 2021-09-04 DIAGNOSIS — Z3490 Encounter for supervision of normal pregnancy, unspecified, unspecified trimester: Secondary | ICD-10-CM

## 2021-09-04 DIAGNOSIS — O99214 Obesity complicating childbirth: Secondary | ICD-10-CM | POA: Diagnosis present

## 2021-09-04 DIAGNOSIS — E282 Polycystic ovarian syndrome: Secondary | ICD-10-CM | POA: Diagnosis present

## 2021-09-04 DIAGNOSIS — O133 Gestational [pregnancy-induced] hypertension without significant proteinuria, third trimester: Secondary | ICD-10-CM

## 2021-09-04 DIAGNOSIS — Z9884 Bariatric surgery status: Secondary | ICD-10-CM

## 2021-09-04 DIAGNOSIS — O134 Gestational [pregnancy-induced] hypertension without significant proteinuria, complicating childbirth: Principal | ICD-10-CM | POA: Diagnosis present

## 2021-09-04 DIAGNOSIS — Z87442 Personal history of urinary calculi: Secondary | ICD-10-CM | POA: Diagnosis not present

## 2021-09-04 DIAGNOSIS — N200 Calculus of kidney: Secondary | ICD-10-CM

## 2021-09-04 LAB — CBC
HCT: 31.2 % — ABNORMAL LOW (ref 36.0–46.0)
Hemoglobin: 10.7 g/dL — ABNORMAL LOW (ref 12.0–15.0)
MCH: 30.4 pg (ref 26.0–34.0)
MCHC: 34.3 g/dL (ref 30.0–36.0)
MCV: 88.6 fL (ref 80.0–100.0)
Platelets: 223 10*3/uL (ref 150–400)
RBC: 3.52 MIL/uL — ABNORMAL LOW (ref 3.87–5.11)
RDW: 12.2 % (ref 11.5–15.5)
WBC: 11.2 10*3/uL — ABNORMAL HIGH (ref 4.0–10.5)
nRBC: 0 % (ref 0.0–0.2)

## 2021-09-04 LAB — COMPREHENSIVE METABOLIC PANEL
ALT: 9 U/L (ref 0–44)
AST: 16 U/L (ref 15–41)
Albumin: 2.6 g/dL — ABNORMAL LOW (ref 3.5–5.0)
Alkaline Phosphatase: 92 U/L (ref 38–126)
Anion gap: 9 (ref 5–15)
BUN: 6 mg/dL (ref 6–20)
CO2: 20 mmol/L — ABNORMAL LOW (ref 22–32)
Calcium: 8.6 mg/dL — ABNORMAL LOW (ref 8.9–10.3)
Chloride: 109 mmol/L (ref 98–111)
Creatinine, Ser: 0.46 mg/dL (ref 0.44–1.00)
GFR, Estimated: >60 mL/min (ref >60)
Glucose, Bld: 94 mg/dL (ref 70–99)
Potassium: 3.5 mmol/L (ref 3.5–5.1)
Sodium: 138 mmol/L (ref 135–145)
Total Bilirubin: 0.1 mg/dL — ABNORMAL LOW (ref 0.3–1.2)
Total Protein: 5.7 g/dL — ABNORMAL LOW (ref 6.5–8.1)

## 2021-09-04 LAB — PROTEIN / CREATININE RATIO, URINE
Creatinine, Urine: 104.04 mg/dL
Protein Creatinine Ratio: 0.52 mg/mg{Cre} — ABNORMAL HIGH (ref 0.00–0.15)
Total Protein, Urine: 54 mg/dL

## 2021-09-04 LAB — TYPE AND SCREEN
ABO/RH(D): O POS
Antibody Screen: NEGATIVE

## 2021-09-04 MED ORDER — LACTATED RINGERS IV SOLN: INTRAVENOUS | Status: DC

## 2021-09-04 MED ORDER — TERBUTALINE SULFATE 1 MG/ML IJ SOLN: 0.2500 mg | Freq: Once | INTRAMUSCULAR | Status: DC | PRN

## 2021-09-04 MED ORDER — OXYCODONE-ACETAMINOPHEN 5-325 MG PO TABS: 1.0000 | ORAL_TABLET | ORAL | Status: DC | PRN

## 2021-09-04 MED ORDER — ACETAMINOPHEN 325 MG PO TABS: 650.0000 mg | ORAL_TABLET | ORAL | Status: DC | PRN

## 2021-09-04 MED ORDER — LACTATED RINGERS IV SOLN: 500.0000 mL | INTRAVENOUS | Status: DC | PRN

## 2021-09-04 MED ORDER — OXYTOCIN BOLUS FROM INFUSION
333.0000 mL | Freq: Once | INTRAVENOUS | Status: AC
  Administered 2021-09-05: 333 mL via INTRAVENOUS

## 2021-09-04 MED ORDER — FLEET ENEMA 7-19 GM/118ML RE ENEM: 1.0000 | ENEMA | RECTAL | Status: DC | PRN

## 2021-09-04 MED ORDER — LIDOCAINE HCL (PF) 1 % IJ SOLN: 30.0000 mL | INTRAMUSCULAR | Status: DC | PRN

## 2021-09-04 MED ORDER — ONDANSETRON HCL 4 MG/2ML IJ SOLN: 4.0000 mg | Freq: Four times a day (QID) | INTRAMUSCULAR | Status: DC | PRN

## 2021-09-04 MED ORDER — OXYCODONE-ACETAMINOPHEN 5-325 MG PO TABS: 2.0000 | ORAL_TABLET | ORAL | Status: DC | PRN

## 2021-09-04 MED ORDER — OXYTOCIN-SODIUM CHLORIDE 30-0.9 UT/500ML-% IV SOLN
1.0000 m[IU]/min | INTRAVENOUS | Status: DC
  Administered 2021-09-04: 2 m[IU]/min via INTRAVENOUS
  Filled 2021-09-04: qty 500

## 2021-09-04 MED ORDER — MISOPROSTOL 50MCG HALF TABLET: 50.0000 ug | ORAL_TABLET | ORAL | Status: DC | PRN

## 2021-09-04 MED ORDER — SOD CITRATE-CITRIC ACID 500-334 MG/5ML PO SOLN: 30.0000 mL | ORAL | Status: DC | PRN

## 2021-09-04 MED ORDER — OXYTOCIN-SODIUM CHLORIDE 30-0.9 UT/500ML-% IV SOLN
2.5000 [IU]/h | INTRAVENOUS | Status: DC
  Filled 2021-09-04: qty 500

## 2021-09-04 NOTE — H&P (Signed)
Adrienne Madden is a 36 y.o. female presenting for Induction of labor for gestational hypertension.  BPs noted to be elevated at home today.  States has irregular contractions at home.    Pregnancy has been followed atg [facility] and remarkable for:  Patient Active Problem List   Diagnosis Date Noted   Gestational hypertension 09/04/2021   Gestational HTN 08/25/2021   Kidney stone complicating pregnancy, third trimester 07/15/2021   Encounter for supervision of normal pregnancy 06/16/2021   S/P laparoscopic sleeve gastrectomy 04/04/2019   Irritable bowel syndrome with both constipation and diarrhea 09/23/2018   PCOS (polycystic ovarian syndrome) 01/03/3006   Umbilical hernia without obstruction and without gangrene 09/20/2017   Nephrolithiasis 01/22/2014   Obesity 11/23/2013   . OB History     Gravida  2   Para  1   Term  1   Preterm  0   AB  0   Living  1      SAB  0   IAB  0   Ectopic  0   Multiple  0   Live Births  1          Past Medical History:  Diagnosis Date   Anxiety    Chlamydia 2011   Concussion 02/2016   History of kidney stones    History of pre-eclampsia    HSV (herpes simplex virus) infection    IBS (irritable bowel syndrome)    no meds   Kidney stone complicating pregnancy    Obesity    Polycystic ovarian syndrome    Past Surgical History:  Procedure Laterality Date   BARIATRIC SURGERY     BREAST ENHANCEMENT SURGERY     EXTRACORPOREAL SHOCK WAVE LITHOTRIPSY Right 08/10/2016   Procedure: RIGHT EXTRACORPOREAL SHOCK WAVE LITHOTRIPSY (ESWL);  Surgeon: Nickie Retort, MD;  Location: [facility];  Service: Urology;  Laterality: Right;   FINGER SURGERY Right    rt hand cyst   LAPAROSCOPIC OVARIAN CYSTECTOMY Left 10/13/2016   Procedure: LAPAROSCOPIC OVARIAN CYSTECTOMY - LEFT DERMOID CYST;  Surgeon: Emily Filbert, MD;  Location: [facility];  Service: Gynecology;  Laterality: Left;   LITHOTRIPSY  2015   WISDOM TOOTH EXTRACTION     Family History:  family history includes Cancer in her paternal grandfather and paternal grandmother; Diabetes in her maternal grandmother and paternal grandmother. Social History:  reports that she has never smoked. She has never used smokeless tobacco. She reports that she does not drink alcohol and does not use drugs.     Maternal Diabetes: No Genetic Screening: Normal Maternal Ultrasounds/Referrals: Normal Fetal Ultrasounds or other Referrals:  None Maternal Substance Abuse:  No Significant Maternal Medications:  None Significant Maternal Lab Results:  Group B Strep negative Other Comments:  None  Review of Systems  Constitutional:  Negative for chills and fever.  Eyes:  Negative for visual disturbance.  Respiratory:  Negative for shortness of breath.   Gastrointestinal:  Negative for abdominal pain, constipation, diarrhea and nausea.  Genitourinary:  Negative for vaginal bleeding.  Musculoskeletal:  Negative for back pain.  Maternal Medical History:  Reason for admission: Nausea. Gestational Hypertension (IOL)  Contractions: Frequency: irregular.   Perceived severity is mild.   Fetal activity: Perceived fetal activity is normal.   Prenatal complications: PIH.   No placental abnormality, pre-eclampsia or preterm labor.   Prenatal Complications - Diabetes: none.  Dilation: 2 Effacement (%): 70 Station: -1 Exam by:: Hansel Feinstein, CNM Blood pressure 117/68, pulse 90, temperature 98.5 F (36.9 C),  temperature source Oral, resp. rate 17, height '5\' 4"'$  (1.626 m), weight 91.7 kg, last menstrual period 12/19/2020, SpO2 99 %, currently breastfeeding. Maternal Exam:  Uterine Assessment: Contraction strength is mild.  Contraction frequency is irregular.  Abdomen: Patient reports no abdominal tenderness. Fetal presentation: vertex Introitus: Normal vulva. Normal vagina.  Ferning test: not done.  Nitrazine test: not done. Pelvis: adequate for delivery.   Cervix: Cervix evaluated by digital exam.      Fetal Exam Fetal Monitor Review: Mode: ultrasound.   Baseline rate: 140.  Variability: moderate (6-25 bpm).   Pattern: accelerations present.   Fetal State Assessment: Category I - tracings are normal.  Physical Exam Constitutional:      General: She is not in acute distress.    Appearance: Normal appearance. She is not ill-appearing or toxic-appearing.  HENT:     Head: Normocephalic.  Cardiovascular:     Rate and Rhythm: Normal rate.  Pulmonary:     Effort: Pulmonary effort is normal.  Abdominal:     General: There is no distension.     Tenderness: There is no abdominal tenderness. There is no guarding.  Genitourinary:    General: Normal vulva.  Musculoskeletal:        General: Normal range of motion.     Cervical back: Normal range of motion.     Left lower leg: No edema.  Skin:    General: Skin is warm and dry.  Neurological:     General: No focal deficit present.     Mental Status: She is alert.     Deep Tendon Reflexes: Reflexes normal.  Psychiatric:        Mood and Affect: Mood normal.    Prenatal labs: ABO, Rh: --/--/O POS (05/25 2025) Antibody: NEG (05/25 2025) Rubella: Immune (11/08 0000) RPR: NON-REACTIVE (03/20 0931)  HBsAg: Negative (11/08 0000)  HIV: NON-REACTIVE (03/20 0931)  GBS: Negative/-- (05/15 0000)   Assessment/Plan: Single IUP at 75w0dGestational Hypertension  Admit to Labor and Delivery Routine orders Foley balloon inserted and inflated to 744mWill start Pitocin via low dose protocol Anticipate SVD    MaHansel Feinstein/25/2023, 9:51 PM

## 2021-09-04 NOTE — Telephone Encounter (Signed)
Babyscripts called  with BP of 143/91 and 142/91. Pt states it is due to anxiety waiting for induction. Per Dr.Duncan  pt does not need to go to [facility] and needs to wait for induction phone  call. Pt expressed understanding.

## 2021-09-05 ENCOUNTER — Encounter (HOSPITAL_COMMUNITY): Payer: Self-pay | Admitting: Obstetrics & Gynecology

## 2021-09-05 ENCOUNTER — Inpatient Hospital Stay (HOSPITAL_COMMUNITY): Payer: Medicaid Other | Admitting: Anesthesiology

## 2021-09-05 DIAGNOSIS — O134 Gestational [pregnancy-induced] hypertension without significant proteinuria, complicating childbirth: Secondary | ICD-10-CM

## 2021-09-05 DIAGNOSIS — Z3A37 37 weeks gestation of pregnancy: Secondary | ICD-10-CM

## 2021-09-05 LAB — CBC
HCT: 32.2 % — ABNORMAL LOW (ref 36.0–46.0)
Hemoglobin: 10.7 g/dL — ABNORMAL LOW (ref 12.0–15.0)
MCH: 30.1 pg (ref 26.0–34.0)
MCHC: 33.2 g/dL (ref 30.0–36.0)
MCV: 90.4 fL (ref 80.0–100.0)
Platelets: 224 10*3/uL (ref 150–400)
RBC: 3.56 MIL/uL — ABNORMAL LOW (ref 3.87–5.11)
RDW: 12.2 % (ref 11.5–15.5)
WBC: 18.7 10*3/uL — ABNORMAL HIGH (ref 4.0–10.5)
nRBC: 0 % (ref 0.0–0.2)

## 2021-09-05 LAB — RPR: RPR Ser Ql: NONREACTIVE

## 2021-09-05 MED ORDER — MEASLES, MUMPS & RUBELLA VAC IJ SOLR: 0.5000 mL | Freq: Once | INTRAMUSCULAR | Status: DC

## 2021-09-05 MED ORDER — BUSPIRONE HCL 5 MG PO TABS: 5.0000 mg | ORAL_TABLET | Freq: Three times a day (TID) | ORAL | Status: DC | PRN

## 2021-09-05 MED ORDER — ASPIRIN 81 MG PO TBEC
81.0000 mg | DELAYED_RELEASE_TABLET | Freq: Every day | ORAL | Status: DC
  Administered 2021-09-05 – 2021-09-06 (×2): 81 mg via ORAL
  Filled 2021-09-05 (×2): qty 1

## 2021-09-05 MED ORDER — PHENYLEPHRINE 80 MCG/ML (10ML) SYRINGE FOR IV PUSH (FOR BLOOD PRESSURE SUPPORT): 80.0000 ug | PREFILLED_SYRINGE | INTRAVENOUS | Status: DC | PRN

## 2021-09-05 MED ORDER — SERTRALINE HCL 100 MG PO TABS
100.0000 mg | ORAL_TABLET | Freq: Every day | ORAL | Status: DC
Start: 2021-09-05 — End: 2021-09-07
  Administered 2021-09-05 – 2021-09-06 (×2): 100 mg via ORAL
  Filled 2021-09-05 (×2): qty 1

## 2021-09-05 MED ORDER — DIBUCAINE (PERIANAL) 1 % EX OINT: 1.0000 "application " | TOPICAL_OINTMENT | CUTANEOUS | Status: DC | PRN

## 2021-09-05 MED ORDER — DIPHENHYDRAMINE HCL 25 MG PO CAPS: 25.0000 mg | ORAL_CAPSULE | Freq: Four times a day (QID) | ORAL | Status: DC | PRN

## 2021-09-05 MED ORDER — TETANUS-DIPHTH-ACELL PERTUSSIS 5-2.5-18.5 LF-MCG/0.5 IM SUSY: 0.5000 mL | PREFILLED_SYRINGE | Freq: Once | INTRAMUSCULAR | Status: DC

## 2021-09-05 MED ORDER — DIPHENHYDRAMINE HCL 50 MG/ML IJ SOLN: 12.5000 mg | INTRAMUSCULAR | Status: DC | PRN

## 2021-09-05 MED ORDER — WITCH HAZEL-GLYCERIN EX PADS: 1.0000 "application " | MEDICATED_PAD | CUTANEOUS | Status: DC | PRN

## 2021-09-05 MED ORDER — ONDANSETRON HCL 4 MG/2ML IJ SOLN: 4.0000 mg | INTRAMUSCULAR | Status: DC | PRN

## 2021-09-05 MED ORDER — SIMETHICONE 80 MG PO CHEW: 80.0000 mg | CHEWABLE_TABLET | ORAL | Status: DC | PRN

## 2021-09-05 MED ORDER — FUROSEMIDE 20 MG PO TABS
20.0000 mg | ORAL_TABLET | Freq: Every day | ORAL | Status: DC
  Administered 2021-09-05 – 2021-09-07 (×2): 20 mg via ORAL
  Filled 2021-09-05 (×2): qty 1

## 2021-09-05 MED ORDER — EPHEDRINE 5 MG/ML INJ: 10.0000 mg | INTRAVENOUS | Status: DC | PRN

## 2021-09-05 MED ORDER — BENZOCAINE-MENTHOL 20-0.5 % EX AERO: 1.0000 "application " | INHALATION_SPRAY | CUTANEOUS | Status: DC | PRN

## 2021-09-05 MED ORDER — PRENATAL MULTIVITAMIN CH
1.0000 | ORAL_TABLET | Freq: Every day | ORAL | Status: DC
  Administered 2021-09-06 – 2021-09-07 (×2): 1 via ORAL
  Filled 2021-09-05 (×2): qty 1

## 2021-09-05 MED ORDER — ACETAMINOPHEN 325 MG PO TABS
650.0000 mg | ORAL_TABLET | ORAL | Status: DC | PRN
  Administered 2021-09-05: 650 mg via ORAL
  Filled 2021-09-05: qty 2

## 2021-09-05 MED ORDER — SENNOSIDES-DOCUSATE SODIUM 8.6-50 MG PO TABS
2.0000 | ORAL_TABLET | Freq: Every day | ORAL | Status: DC
  Administered 2021-09-07: 2 via ORAL
  Filled 2021-09-05: qty 2

## 2021-09-05 MED ORDER — COCONUT OIL OIL: 1.0000 "application " | TOPICAL_OIL | Status: DC | PRN

## 2021-09-05 MED ORDER — IBUPROFEN 600 MG PO TABS
600.0000 mg | ORAL_TABLET | Freq: Four times a day (QID) | ORAL | Status: DC
  Administered 2021-09-05 – 2021-09-07 (×6): 600 mg via ORAL
  Filled 2021-09-05 (×7): qty 1

## 2021-09-05 MED ORDER — LIDOCAINE HCL (PF) 1 % IJ SOLN
INTRAMUSCULAR | Status: DC | PRN
  Administered 2021-09-05 (×2): 4 mL via EPIDURAL

## 2021-09-05 MED ORDER — ONDANSETRON HCL 4 MG PO TABS: 4.0000 mg | ORAL_TABLET | ORAL | Status: DC | PRN

## 2021-09-05 MED ORDER — LACTATED RINGERS IV SOLN
500.0000 mL | Freq: Once | INTRAVENOUS | Status: AC
  Administered 2021-09-05: 500 mL via INTRAVENOUS

## 2021-09-05 MED ORDER — ZOLPIDEM TARTRATE 5 MG PO TABS: 5.0000 mg | ORAL_TABLET | Freq: Every evening | ORAL | Status: DC | PRN

## 2021-09-05 MED ORDER — FENTANYL-BUPIVACAINE-NACL 0.5-0.125-0.9 MG/250ML-% EP SOLN
12.0000 mL/h | EPIDURAL | Status: DC | PRN
  Administered 2021-09-05: 12 mL/h via EPIDURAL
  Filled 2021-09-05: qty 250

## 2021-09-05 NOTE — Discharge Summary (Shared)
Postpartum Discharge Summary  Date of Service updated***     Patient Name: Adrienne Madden DOB: [id number] MRN: [id number]  Date of admission: 09/04/2021 Delivery date:09/05/2021  Delivering provider:   Date of discharge: 09/05/2021  Admitting diagnosis: Gestational hypertension [O13.9] Intrauterine pregnancy: [redacted]w[redacted]d    Secondary diagnosis:  Principal Problem:   Vaginal delivery Active Problems:   Encounter for supervision of normal pregnancy   Kidney stone complicating pregnancy, third trimester   Irritable bowel syndrome with both constipation and diarrhea   PCOS (polycystic ovarian syndrome)   S/P laparoscopic sleeve gastrectomy   Gestational hypertension  Additional problems: depression, nephrolithiasis   Discharge diagnosis: Term Pregnancy Delivered and Gestational Hypertension                                              Post partum procedures:*** Augmentation: AROM, Pitocin, and IP Foley Complications: {OB Labor/Delivery Complications:20784}  Hospital course: Induction of Labor With Vaginal Delivery   36y.o. yo G2P1001 at 383w1das admitted to the hospital 09/04/2021 for induction of labor.  Indication for induction: Gestational hypertension.  Patient had an uncomplicated labor course as follows: Membrane Rupture Time/Date: 10:06 AM ,09/05/2021   Delivery Method:Vaginal, Spontaneous  Episiotomy: None  Lacerations:  None  Details of delivery can be found in separate delivery note.  Patient had a postpartum course in which she was given lasix. Patient is discharged home 09/05/21.  Newborn Data: Birth date:09/05/2021  Birth time:4:29 PM  Gender:Female  Living status:  Apgars: ,  Weight:   Magnesium Sulfate received: No BMZ received: No Rhophylac:No MMR:No T-DaP:Given prenatally Flu: No Transfusion:No  Physical exam  Vitals:   09/05/21 1401 09/05/21 1431 09/05/21 1502 09/05/21 1530  BP: (!) 105/35 110/61 102/60 (!) 94/55  Pulse: 87 85 (!) 164 87  Resp:       Temp: 98 F (36.7 C)     TempSrc: Oral     SpO2:      Weight:      Height:       General: alert, cooperative, and no distress Lochia: appropriate Uterine Fundus: firm Incision: Healing well with no significant drainage DVT Evaluation: No evidence of DVT seen on physical exam. Labs: Lab Results  Component Value Date   WBC 11.2 (H) 09/04/2021   HGB 10.7 (L) 09/04/2021   HCT 31.2 (L) 09/04/2021   MCV 88.6 09/04/2021   PLT 223 09/04/2021      Latest Ref Rng & Units 09/04/2021    8:30 PM  CMP  Glucose 70 - 99 mg/dL 94    BUN 6 - 20 mg/dL 6    Creatinine 0.44 - 1.00 mg/dL 0.46    Sodium 135 - 145 mmol/L 138    Potassium 3.5 - 5.1 mmol/L 3.5    Chloride 98 - 111 mmol/L 109    CO2 22 - 32 mmol/L 20    Calcium 8.9 - 10.3 mg/dL 8.6    Total Protein 6.5 - 8.1 g/dL 5.7    Total Bilirubin 0.3 - 1.2 mg/dL 0.1    Alkaline Phos 38 - 126 U/L 92    AST 15 - 41 U/L 16    ALT 0 - 44 U/L 9     Edinburgh Score:    08/23/2017    9:23 AM  Edinburgh Postnatal Depression Scale Screening Tool  I have been able to  laugh and see the funny side of things. 0  I have looked forward with enjoyment to things. 0  I have been anxious or worried for no good reason. 0  I have felt scared or panicky for no good reason. 0  Things have been getting on top of me. 0  I have been so unhappy that I have had difficulty sleeping. 0  I have felt sad or miserable. 0  I have been so unhappy that I have been crying. 0  The thought of harming myself has occurred to me. 0     After visit meds:  Allergies as of 09/05/2021       Reactions   Lactose Intolerance (gi) Diarrhea   Hydrocodone Nausea And Vomiting     Med Rec must be completed prior to using this [facility]***        Discharge home in stable condition Infant Feeding: Breast Infant Disposition:home with mother Discharge instruction: per After Visit Summary and Postpartum booklet. Activity: Advance as tolerated. Pelvic rest for 6 weeks.   Diet: routine diet Future Appointments: Future Appointments  Date Time Provider [place]  10/13/2021  9:30 AM Guss Bunde, MD [facility]   Follow up Visit:  [place] [facility] Follow up in 1 week(s).   Specialty: Obstetrics and Gynecology Why: blood pressure check Contact information: [place], [place] [phone]                Message sent 09/05/2021 Please schedule this patient for a In person postpartum visit in 4 weeks with the following provider: Any provider. Additional Postpartum F/U:BP check 1 week  High risk pregnancy complicated by: HTN Delivery mode:  Vaginal, Spontaneous  Anticipated Birth Control:  IUD   09/05/2021 Donnamae Jude, MD

## 2021-09-05 NOTE — Progress Notes (Signed)
Patient ID: Adrienne Madden, female   DOB: Apr 20, 1985, 36 y.o.   MRN: [id number] Comfortable with epidural  Vitals:   09/05/21 0300 09/05/21 0340 09/05/21 0400 09/05/21 0430  BP: 111/65 106/66 109/65 106/67  Pulse: 97 96 92 93  Resp:      Temp:      TempSrc:      SpO2:      Weight:      Height:       FHR stable  Dilation: 5 Effacement (%): 70 Station: -2 Presentation: Vertex Exam by:: Hansel Feinstein, CNM No change since last exam  Will continue to increase Pitocin Consider AROM next exam

## 2021-09-05 NOTE — Anesthesia Preprocedure Evaluation (Signed)
Anesthesia Evaluation  Patient identified by MRN, date of birth, ID band Patient awake    Reviewed: Allergy & Precautions, Patient's Chart, lab work & pertinent test results  History of Anesthesia Complications Negative for: history of anesthetic complications  Airway Mallampati: II  TM Distance: >3 FB Neck ROM: Full    Dental no notable dental hx.    Pulmonary neg pulmonary ROS,    Pulmonary exam normal        Cardiovascular hypertension (gestational ), Normal cardiovascular exam     Neuro/Psych Anxiety negative neurological ROS     GI/Hepatic negative GI ROS, Neg liver ROS,   Endo/Other  negative endocrine ROS  Renal/GU   negative genitourinary   Musculoskeletal negative musculoskeletal ROS (+)   Abdominal   Peds  Hematology negative hematology ROS (+)   Anesthesia Other Findings Day of surgery medications reviewed with patient.  Reproductive/Obstetrics (+) Pregnancy                             Anesthesia Physical Anesthesia Plan  ASA: 2  Anesthesia Plan: Epidural   Post-op Pain Management:    Induction:   PONV Risk Score and Plan: Treatment may vary due to age or medical condition  Airway Management Planned: Natural Airway  Additional Equipment: Fetal Monitoring  Intra-op Plan:   Post-operative Plan:   Informed Consent: I have reviewed the patients History and Physical, chart, labs and discussed the procedure including the risks, benefits and alternatives for the proposed anesthesia with the patient or authorized representative who has indicated his/her understanding and acceptance.       Plan Discussed with:   Anesthesia Plan Comments:         Anesthesia Quick Evaluation

## 2021-09-05 NOTE — Progress Notes (Signed)
Labor Progress Note Adrienne Madden is a 36 y.o. G2P1001 at 31w1dpresented for IOL due to gestational hypertension.   S: Doing well, comfortable with epidural   O:  BP 100/65   Pulse 88   Temp 97.8 F (36.6 C) (Oral)   Resp 17   Ht '5\' 4"'$  (1.626 m)   Wt 91.7 kg   LMP 12/19/2020   SpO2 100%   BMI 34.71 kg/m  EFM: 150/mod/10x10/none  CVE: Dilation: 5.5 Effacement (%): 70 Station: -2 Presentation: Vertex Exam by:: Dr. BHiginio Plan  A&P: 36y.o. G2P1001 315w1d#Labor: Largely unchanged since last check but head well applied with contraction. After consent, performed AROM with moderate amount of clear fluid. Head remained well applied afterwards.  #Pain: Epidural  #FWB: Cat I  #GBS negative  #gHTN: BP WNL. No symptoms. Cont to monitor.   SaPatriciaann ClanDO

## 2021-09-05 NOTE — Progress Notes (Signed)
Patient ID: Adrienne Madden, female   DOB: 1986/02/20, 36 y.o.   MRN: [id number] Doing well  Vitals:   09/05/21 0100 09/05/21 0105 09/05/21 0115 09/05/21 0130  BP: 105/62 113/68 (!) 111/52 112/63  Pulse: 85 88 78 83  Resp:      Temp:      TempSrc:      SpO2:      Weight:      Height:       FHR reassuring UCs not tracing well  Dilation: 5 Effacement (%): 70 Station: -1 Presentation: Vertex Exam by:: Renella Cunas, RN  (Last exam was 2cm)  Will continue to observe

## 2021-09-05 NOTE — Anesthesia Procedure Notes (Signed)
Epidural Patient location during procedure: OB Start time: 09/05/2021 12:27 AM End time: 09/05/2021 12:30 AM  Staffing Anesthesiologist: Brennan Bailey, MD Performed: anesthesiologist   Preanesthetic Checklist Completed: patient identified, IV checked, risks and benefits discussed, monitors and equipment checked, pre-op evaluation and timeout performed  Epidural Patient position: sitting Prep: DuraPrep and site prepped and draped Patient monitoring: continuous pulse ox, blood pressure and heart rate Approach: midline Location: L3-L4 Injection technique: LOR air  Needle:  Needle type: Tuohy  Needle gauge: 17 G Needle length: 9 cm Needle insertion depth: 7 cm Catheter type: closed end flexible Catheter size: 19 Gauge Catheter at skin depth: 12 cm Test dose: negative and Other (1% lidocaine)  Assessment Events: blood not aspirated, injection not painful, no injection resistance, no paresthesia and negative IV test  Additional Notes Patient identified. Risks, benefits, and alternatives discussed with patient including but not limited to bleeding, infection, nerve damage, paralysis, failed block, incomplete pain control, headache, blood pressure changes, nausea, vomiting, reactions to medication, itching, and postpartum back pain. Confirmed with bedside nurse the patient's most recent platelet count. Confirmed with patient that they are not currently taking any anticoagulation, have any bleeding history, or any family history of bleeding disorders. Patient expressed understanding and wished to proceed. All questions were answered. Sterile technique was used throughout the entire procedure. Please see nursing notes for vital signs.   Crisp LOR after one needle redirection. Test dose was given through epidural catheter and negative prior to continuing to dose epidural or start infusion. Warning signs of high block given to the patient including shortness of breath, tingling/numbness in  hands, complete motor block, or any concerning symptoms with instructions to call for help. Patient was given instructions on fall risk and not to get out of bed. All questions and concerns addressed with instructions to call with any issues or inadequate analgesia.  Reason for block:procedure for pain

## 2021-09-06 LAB — CBC
HCT: 29 % — ABNORMAL LOW (ref 36.0–46.0)
Hemoglobin: 9.7 g/dL — ABNORMAL LOW (ref 12.0–15.0)
MCH: 29.7 pg (ref 26.0–34.0)
MCHC: 33.4 g/dL (ref 30.0–36.0)
MCV: 88.7 fL (ref 80.0–100.0)
Platelets: 196 10*3/uL (ref 150–400)
RBC: 3.27 MIL/uL — ABNORMAL LOW (ref 3.87–5.11)
RDW: 12.4 % (ref 11.5–15.5)
WBC: 13.4 10*3/uL — ABNORMAL HIGH (ref 4.0–10.5)
nRBC: 0 % (ref 0.0–0.2)

## 2021-09-06 NOTE — Progress Notes (Signed)
Post Partum Day 1 Subjective: no complaints, up ad lib, voiding and tolerating PO, small lochia, plans to breastfeed, IUD Baby in NICU for grunting Objective: Blood pressure 110/60, pulse 80, temperature 98.4 F (36.9 C), temperature source Oral, resp. rate 16, height '5\' 4"'$  (1.626 m), weight 91.7 kg, last menstrual period 12/19/2020, SpO2 100 %, unknown if currently breastfeeding.  Physical Exam:  General: alert, cooperative and no distress Lochia:normal flow Chest: CTAB Heart: RRR no m/r/g Abdomen: +BS, soft, nontender,  Uterine Fundus: firm DVT Evaluation: No evidence of DVT seen on physical exam. Extremities: trace edema  Recent Labs    09/05/21 1803 09/06/21 0621  HGB 10.7* 9.7*  HCT 32.2* 29.0*    Assessment/Plan: Plan for discharge tomorrow, Lactation consult, Circumcision prior to discharge, and Contraception outpt IUD   LOS: 2 days   Adrienne Madden 09/06/2021, 9:55 AM

## 2021-09-06 NOTE — Lactation Note (Signed)
This note was copied from a baby's chart.  NICU Lactation Consultation Note  Patient Name: Adrienne Madden [id number] Date: 09/06/2021 Age:34 hours   Subjective Reason for consult: Initial assessment Mother recalls hx of insufficient milk supply with first child and appearance of IGT. She is aware that she may not bring in a full milk supply but plans to challenge exclusive breastfeeding when infant is ready. She has been pumping regularly today and bringing colostrum to NICU.   Objective Infant data: Mother's Current Feeding Choice: Breast Milk  Infant feeding assessment Scale for Readiness: 3   Maternal data: [id number]  Vaginal, Spontaneous Significant Breast History:: Hx IGT (tubular shape), recent breast lift and implant  Previous breastfeeding challenges?: Low milk supply; Other (Comment)  Does the patient have breastfeeding experience prior to this delivery?: Yes How long did the patient breastfeed?: 4 mo breast and bottle  Pumping frequency: q3-4h with drops of colostrum today as expected  Risk factor for low milk supply:: hx low milk supply, hx IGT   Pump: DEBP, Personal (Medela at home)  Assessment Infant: Feeding Status: NPO   Maternal: Mother will likely produce less than a full milk supply related to hx of insufficient glandular tissue.   Intervention/Plan Interventions: Education  Tools: Pump Pump Education: Setup, frequency, and cleaning; Milk Storage  Plan: Consult Status: NICU follow-up  NICU Follow-up type: New admission follow up; Verify absence of engorgement; Weekly NICU follow up; Maternal D/C visit; Verify onset of copious milk  Mother to pump q3 and bring any EBM to NICU.  Gwynne Edinger 09/06/2021, 11:03 AM

## 2021-09-07 MED ORDER — IBUPROFEN 600 MG PO TABS: 600.0000 mg | ORAL_TABLET | Freq: Four times a day (QID) | ORAL | 0 refills | Status: DC | PRN

## 2021-09-07 NOTE — Lactation Note (Signed)
This note was copied from a baby's chart.  NICU Lactation Consultation Note  Patient Name: Boy Loura Pitt [id number] Date: 09/07/2021 Age:34 hours  Subjective Reason for consult: Follow-up assessment; NICU baby; Early term 18-38.6wks; Maternal discharge; Maternal endocrine disorder  Visited with mom of 42 hours old ETI NICU female, she's a P2 and reports pumping is going OK, she's getting small volumes, praised her for her efforts. Ms. Elsey is getting discharged today. Reviewed discharge education, lactogenesis II, pumping schedule and expectations. Noticed that she hasn't been pumping consistently, explained the importance of consistent pumping for the onset of lactogenesis II and the prevention of engorgement. She didn't get coconut oil while at the hospital, she'll get some on her own to use prior pumping.  Objective Infant data: Mother's Current Feeding Choice: Breast Milk and Donor Milk Infant feeding assessment Scale for Readiness: 4  Maternal data: G2P2002  Vaginal, Spontaneous Significant Breast History:: Hx IGT (tubular shape), recent breast lift and implant Previous breastfeeding challenges?: Low milk supply; Other (Comment) Does the patient have breastfeeding experience prior to this delivery?: Yes How long did the patient breastfeed?: 4 mo breast and bottle Pumping frequency: 4-6 times/24 hours Pumped volume: 2 mL Risk factor for low milk supply:: hx low milk supply, hx IGT Pump: DEBP, Personal (Medela DEBP at home)  Assessment Infant: Feeding Status: NPO  Maternal: Milk volume: Normal  Intervention/Plan Interventions: Breast feeding basics reviewed; DEBP; Coconut oil; Education Tools: Pump Pump Education: Setup, frequency, and cleaning; Milk Storage  Plan of care: Encouraged mom to start pumping consistently every 3 hours, at least 8 times/24 hours She'll bring all pump parts to baby's room  FOB present. All questions and concerns answered, family to  contact [facility] services PRN.  Consult Status: NICU follow-up NICU Follow-up type: Verify onset of copious milk; Verify absence of engorgement; Weekly NICU follow up   [employer] 09/07/2021, 12:06 PM

## 2021-09-07 NOTE — Anesthesia Postprocedure Evaluation (Signed)
Anesthesia Post Note  Patient: Adrienne Madden  Procedure(s) Performed: AN AD HOC LABOR EPIDURAL     Patient location during evaluation: Mother Baby Anesthesia Type: Epidural Level of consciousness: awake and alert Pain management: pain level controlled Vital Signs Assessment: post-procedure vital signs reviewed and stable Respiratory status: spontaneous breathing, nonlabored ventilation and respiratory function stable Cardiovascular status: stable Postop Assessment: no headache, no backache and epidural receding Anesthetic complications: no   No notable events documented.  Last Vitals:  Vitals:   09/06/21 2339 09/07/21 0530  BP: 132/62 117/73  Pulse: 88 65  Resp: 16 19  Temp: 36.9 C   SpO2: 98%     Last Pain:  Vitals:   09/07/21 0527  TempSrc:   PainSc: 4    Pain Goal:                   Jaymar Loeber

## 2021-09-09 ENCOUNTER — Telehealth: Payer: Self-pay | Admitting: *Deleted

## 2021-09-09 NOTE — Telephone Encounter (Signed)
-----   Message from Donnamae Jude, MD sent at 09/05/2021  4:56 PM EDT ----- Please schedule this patient for a In person postpartum visit in 4 weeks with the following provider: Any provider. Additional Postpartum F/U:BP check 1 week  High risk pregnancy complicated by: HTN Delivery mode: Vaginal, Spontaneous  Anticipated Birth Control: IUD

## 2021-09-09 NOTE — Telephone Encounter (Signed)
Patient will call back to schedule 1 week BP check due to baby being in the NICU.

## 2021-09-10 ENCOUNTER — Ambulatory Visit: Payer: Self-pay

## 2021-09-10 NOTE — Lactation Note (Signed)
This note was copied from a baby's chart.  NICU Lactation Consultation Note  Patient Name: Adrienne Madden [id number] Date: 09/10/2021 Age:36 years  Subjective Reason for consult: Follow-up assessment; NICU baby; Early term 30-38.6wks; Maternal endocrine disorder  Visited with mom of 56 days old ETI NICU female, she's a P2 and reported a delayed onset of lactogenesis II. She endorses consistent pumping but also pointed out that the same thing happened with her first baby, she voiced "I have tubular breasts". Ask Ms. Hoffmann to check with her OBGYN to see if she can start taking Reglan. Ms. Rodena Piety also said that she tried several OTC galactagogues in the past but that they only helped so much, till a certain point. Reviewed some strategies to increase her supply.  Objective Infant data: Mother's Current Feeding Choice: Breast Milk and Donor Milk Infant feeding assessment Scale for Readiness: 3  Maternal data: G2P2002  Vaginal, Spontaneous Pumping frequency: 7-8 times/24 hours Pumped volume: 10 mL (10-15 ml) Pump: DEBP, Personal (Medela DEBP at home)  Assessment Infant: In NICU  Maternal: Milk volume: Low  Intervention/Plan Interventions: Breast feeding basics reviewed; DEBP; Education Tools: Pump; Coconut oil Pump Education: Setup, frequency, and cleaning; Milk Storage  Plan of care: Encouraged mom to continue pumping consistently every 3 hours, at least 8 times/24 hours She'll start using coconut oil prior pumping She'll start power pumping in the AM   No other support person at this point. All questions and concerns answered, family to contact [facility] services PRN.  Consult Status: NICU follow-up NICU Follow-up type: Verify onset of copious milk; Verify absence of engorgement; Weekly NICU follow up   [place] 09/10/2021, 12:33 PM

## 2021-09-13 ENCOUNTER — Ambulatory Visit: Payer: Self-pay

## 2021-09-13 ENCOUNTER — Telehealth (HOSPITAL_COMMUNITY): Payer: Self-pay | Admitting: *Deleted

## 2021-09-13 NOTE — Lactation Note (Signed)
This note was copied from a baby's chart.  NICU Lactation Consultation Note  Patient Name: Adrienne Madden [id number] Date: 09/13/2021 Age:34 days   Subjective Reason for consult: Follow-up assessment Mother continues to pump frequently and is offering the breast during some feeding times. She is aware that she may not bring in a full supply of milk because of her hx of IGT. Mother is comfortable latching infant. She is aware of LC support in  NICU prn.   Objective Infant data: Mother's Current Feeding Choice: Breast Milk and Formula  Infant feeding assessment Scale for Readiness: 1 Scale for Quality: 2     Maternal data: [id number]  Vaginal, Spontaneous Pumping frequency: q3h Pumped volume: 20 mL  Pump: DEBP, Personal (Medela DEBP at home)  Assessment Infant: Infant will likely need supplementation p bf'ing related to mom's risk of low milk supply.   Maternal: Milk volume: Low Mother may not bring in a full supply of milk. She will likely continue producing some milk as long as she continues to bf or pump.   Intervention/Plan Interventions: Education  Plan: Consult Status: NICU follow-up  NICU Follow-up type: Weekly NICU follow up; Assist with IDF-2 (Mother does not need to pre-pump before breastfeeding)    Gwynne Edinger 09/13/2021, 10:53 AM

## 2021-09-13 NOTE — Telephone Encounter (Signed)
Attempted Hospital Discharge Follow-Up Call.  Left voice mail requesting that patient return RN's phone call.  

## 2021-10-10 NOTE — Progress Notes (Unsigned)
[place] Partum Visit Note  Adrienne Madden is a 36 y.o. G16P2002 female who presents for a postpartum visit. She is 5 weeks postpartum following a normal spontaneous vaginal delivery.  I have fully reviewed the prenatal and intrapartum course. The delivery was at 37.1 gestational weeks.  Anesthesia: epidural. Postpartum course has been unremarkable. Baby is doing well. Baby is feeding by bottle - Ovidio Kin . Bleeding staining only. Bowel function is normal. Bladder function is normal. Patient is not sexually active. Contraception method is IUD. Postpartum depression screening: negative.   The pregnancy intention screening data noted above was reviewed. Potential methods of contraception were discussed. The patient elected to proceed with No data recorded.    Health Maintenance Due  Topic Date Due   COVID-19 Vaccine (1) Never done   HPV VACCINES (2 - 3-dose series) 08/24/2011    The following portions of the patient's history were reviewed and updated as appropriate: allergies, current medications, past family history, past medical history, past social history, past surgical history, and problem list.  Review of Systems Pertinent items noted in HPI and remainder of comprehensive ROS otherwise negative.  Objective:  LMP 12/19/2020    General:  alert, cooperative, and no distress   Breasts:  normal  Lungs: clear to auscultation bilaterally  Heart:  regular rate and rhythm  Abdomen: soft, non-tender; bowel sounds normal; no masses,  no organomegaly   Wound N/a  GU exam:  normal       Assessment:    There are no diagnoses linked to this encounter.  Normal postpartum exam.   Plan:   Essential components of care per ACOG recommendations:  1.  Mood and well being: Patient with negative depression screening today. Reviewed local resources for support.  - Patient tobacco use? No.   - hx of drug use? No.    2. Infant care and feeding:  -Patient currently breastmilk  feeding? No.  -Social determinants of health (SDOH) reviewed in EPIC. No concerns  3. Sexuality, contraception and birth spacing - Patient does not want a pregnancy in the next year.  Desired family size is unsure. - Reviewed reproductive life planning. Reviewed contraceptive methods based on pt preferences and effectiveness.  Patient desired IUD or IUS today.   - Discussed birth spacing of 18 months  4. Sleep and fatigue -Encouraged family/partner/community support of 4 hrs of uninterrupted sleep to help with mood and fatigue  5. Physical Recovery  - Discussed patients delivery and complications. She describes her labor as good. - Patient had a Vaginal, no problems at delivery. Patient had a  none  laceration. Perineal healing reviewed. Patient expressed understanding - Patient has urinary incontinence? No. - Patient is safe to resume physical and sexual activity  6.  Health Maintenance - HM due items addressed --none due - Last pap smear No results found for: "DIAGPAP" Pap smear not done at today's visit. Last pap was 11/22 -Breast Cancer screening indicated?  7. Chronic Disease/Pregnancy Condition follow up:  Looking for urologist that will take her insurance (kidney stones) GHTN has resolved  IUD Procedure Note Patient identified, informed consent performed.  Discussed risks of irregular bleeding, cramping, infection, malpositioning or misplacement of the IUD outside the uterus which may require further procedures. Time out was performed.  Urine pregnancy test negative.  Speculum placed in the vagina.  Cervix visualized.  Cleaned with Betadine x 2.  Grasped anteriorly with a single tooth tenaculum.  Uterus sounded to 8.5 cm.  Mirena IUD  placed per manufacturer's recommendations.  Strings trimmed to 3 cm. Tenaculum was removed, good hemostasis noted.  Patient tolerated procedure well.   Patient was given post-procedure instructions and the Mirena care card with expiration date.   Patient was also asked to check IUD strings periodically and follow up in 4-6 weeks for IUD check.   Silas Sacramento, MD  Center for [employer], [place]

## 2021-10-13 ENCOUNTER — Ambulatory Visit (INDEPENDENT_AMBULATORY_CARE_PROVIDER_SITE_OTHER): Payer: Medicaid Other | Admitting: Obstetrics & Gynecology

## 2021-10-13 ENCOUNTER — Encounter: Payer: Self-pay | Admitting: Obstetrics & Gynecology

## 2021-10-13 VITALS — BP 103/71 | HR 86 | Ht 62.0 in | Wt 173.0 lb

## 2021-10-13 DIAGNOSIS — Z30430 Encounter for insertion of intrauterine contraceptive device: Secondary | ICD-10-CM | POA: Diagnosis not present

## 2021-10-13 LAB — POCT URINE PREGNANCY: Preg Test, Ur: NEGATIVE

## 2021-10-13 MED ORDER — LEVONORGESTREL 20 MCG/DAY IU IUD
1.0000 | INTRAUTERINE_SYSTEM | Freq: Once | INTRAUTERINE | Status: AC
  Administered 2021-10-13: 1 via INTRAUTERINE

## 2021-11-18 ENCOUNTER — Encounter: Payer: Self-pay | Admitting: Advanced Practice Midwife

## 2021-11-24 ENCOUNTER — Encounter: Payer: Self-pay | Admitting: Obstetrics & Gynecology

## 2021-11-24 ENCOUNTER — Ambulatory Visit (INDEPENDENT_AMBULATORY_CARE_PROVIDER_SITE_OTHER): Payer: Medicaid Other | Admitting: Obstetrics & Gynecology

## 2021-11-24 VITALS — BP 112/76 | HR 81 | Ht 62.0 in | Wt 181.0 lb

## 2021-11-24 DIAGNOSIS — N6489 Other specified disorders of breast: Secondary | ICD-10-CM

## 2021-11-24 MED ORDER — BUPROPION HCL ER (XL) 150 MG PO TB24: 150.0000 mg | ORAL_TABLET | Freq: Every day | ORAL | 6 refills | Status: DC

## 2021-11-24 NOTE — Progress Notes (Signed)
   Subjective:    Patient ID: Adrienne Madden, female    DOB: May 27, 1985, 36 y.o.   MRN: [id number]  HPI  Pt presents for IUD string check.  No bleding.  No pain.  Pt also c/o breast seroma.  R breast much larger than left breast.  She is well over a year from augmentation and has had a seroma in the past that got better with Doxy bid for 30 days and singulair. Her plastic surgeon is in [place] and she can't get there to see him.   Review of Systems  Constitutional: Negative.   Respiratory: Negative.    Cardiovascular: Negative.   Gastrointestinal: Negative.   Genitourinary: Negative.        Right breast markedly larger than left breast       Objective:   Physical Exam Vitals reviewed.  Constitutional:      General: She is not in acute distress.    Appearance: She is well-developed.  HENT:     Head: Normocephalic and atraumatic.  Eyes:     Conjunctiva/sclera: Conjunctivae normal.  Cardiovascular:     Rate and Rhythm: Normal rate.  Pulmonary:     Effort: Pulmonary effort is normal.  Chest:    Genitourinary:    General: Normal vulva.     Comments: IUD strings 3 cm, no lesion Skin:    General: Skin is warm and dry.  Neurological:     Mental Status: She is alert and oriented to person, place, and time.  Psychiatric:        Mood and Affect: Mood normal.       Assessment & Plan:   IUD-->normal, strings approx 2 cm Breast Seroma--Discussed case with Fr. Matt wakefield, brest specialist.  The the seroma needs to drained and fluid sent for cytology b/c of risk for lymphoma.  I provided 30 minutes of verbal and non-verbal time during this encounter date, time was needed to gather information, review chart, records, communicate/coordinate with staff, as well as complete documentation.

## 2021-11-24 NOTE — Progress Notes (Signed)
Last pap 02/18/21- negative

## 2021-11-26 ENCOUNTER — Telehealth: Payer: Self-pay | Admitting: Plastic Surgery

## 2021-11-26 NOTE — Telephone Encounter (Signed)
Called the pt and she is stating that she is not able to make an appointment at this time due to her having started a new job and she is in her 60 day probationary period and she this afternoon she will have to be at a open house for her 34 year old.  Pt also stated that she is a single mother and can not afford to make any kind of appointments but she do know that her insurance will pay everything w/out her paying any co-pays in [place] where she had her surgery.  Pt stated that her OB-GYN Silas Sacramento, MD referring provider) will be prescribing her a ABX and singulair for the seroma and she will be taking it for 30 days to see if that will help her like it did in the past. Pt stated that if it does not work and she will be needing to get her breast aspirated she will let her OB-GYN know and then she will reach out to our office.  But at this present time she is wanting to wait.

## 2021-11-28 ENCOUNTER — Other Ambulatory Visit: Payer: Self-pay | Admitting: *Deleted

## 2021-11-28 DIAGNOSIS — N6489 Other specified disorders of breast: Secondary | ICD-10-CM

## 2021-11-28 NOTE — Progress Notes (Signed)
Amb referral placed for [facility] DO for breast seroma.  Dr Gala Romney has spoken with Dr Marla Roe and she has agreed to see pt for a consult.

## 2021-12-16 ENCOUNTER — Institutional Professional Consult (permissible substitution): Payer: Medicaid Other | Admitting: Plastic Surgery

## 2022-10-13 IMAGING — US US RENAL
1 series · 15 of 25 positions shown · non-contrast
Comparison: CT 08/03/2016

CLINICAL DATA: Right flank pain. Electronic records indicates
pregnant patient is EDD 09/25/2021

EXAM:
RENAL / URINARY TRACT ULTRASOUND COMPLETE

[Series 1: us renal · 15 of 73 slices shown]
[im 1/73]
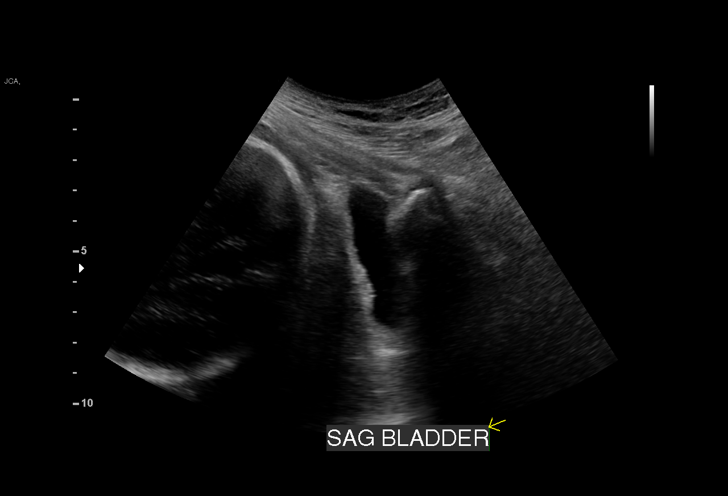
[im 7/73]
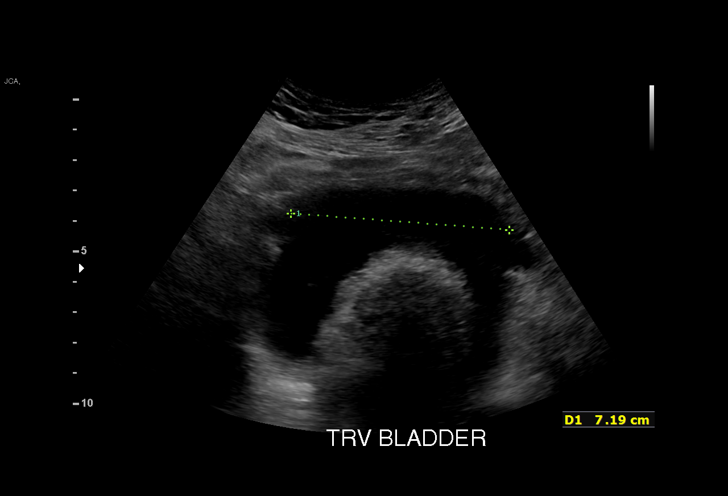
[im 13/73]
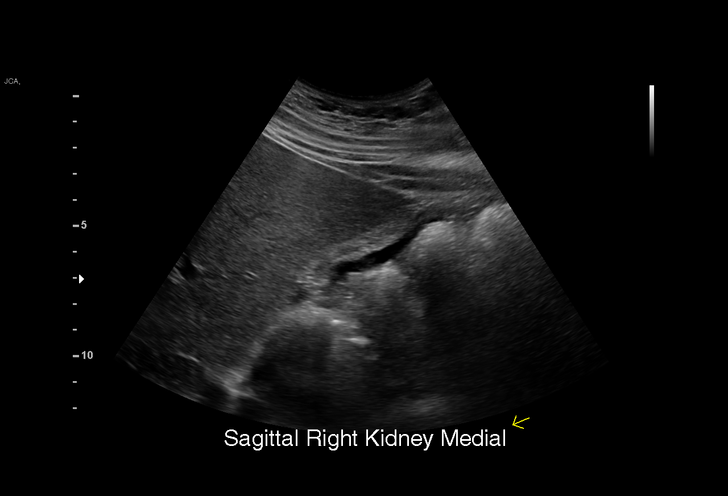
[im 16/73]
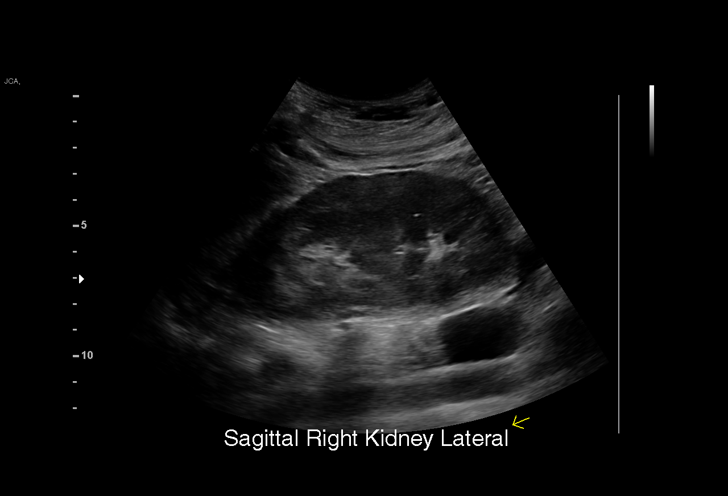
[im 22/73]
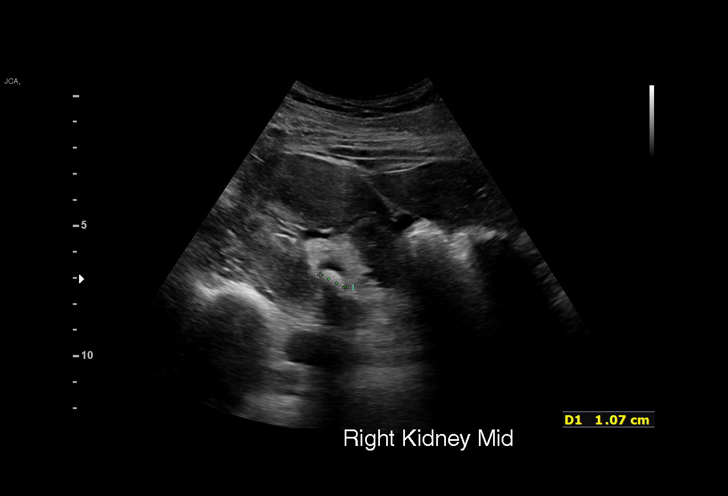
[im 28/73]
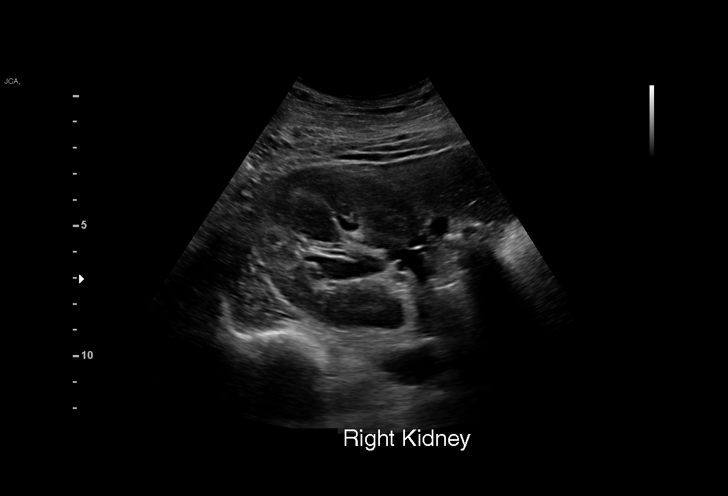
[im 31/73]
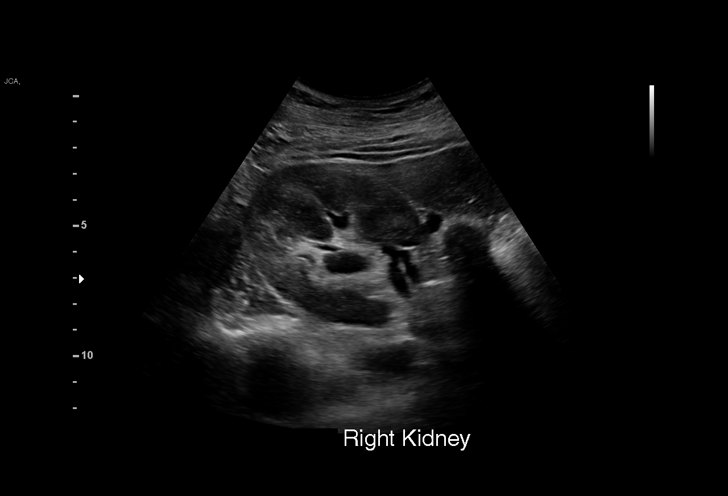
[im 37/73]
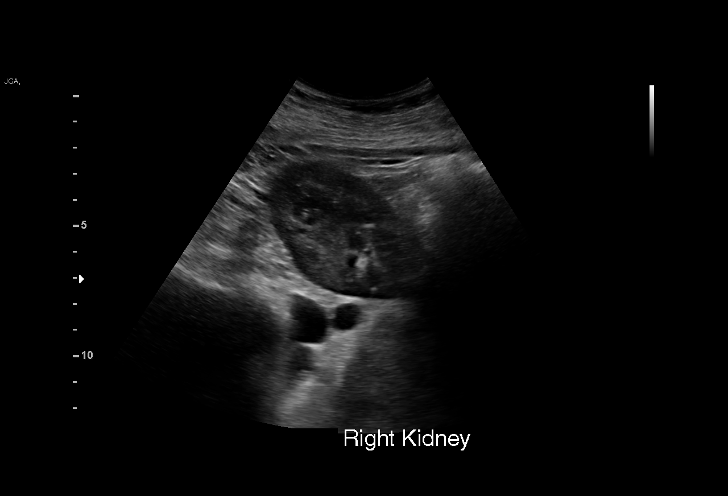
[im 43/73]
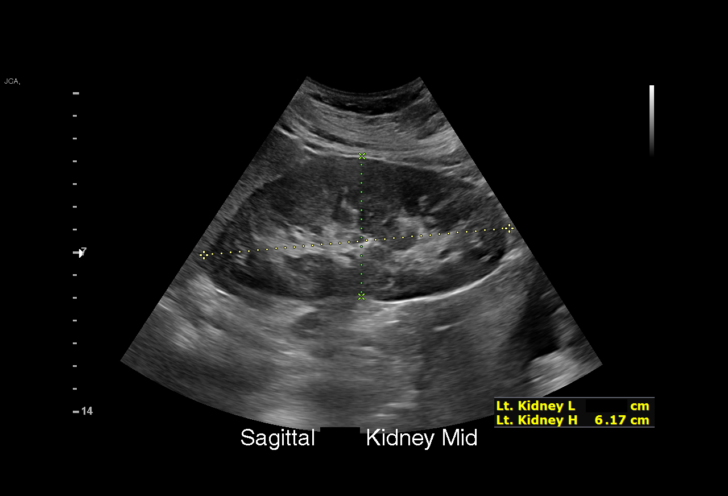
[im 46/73]
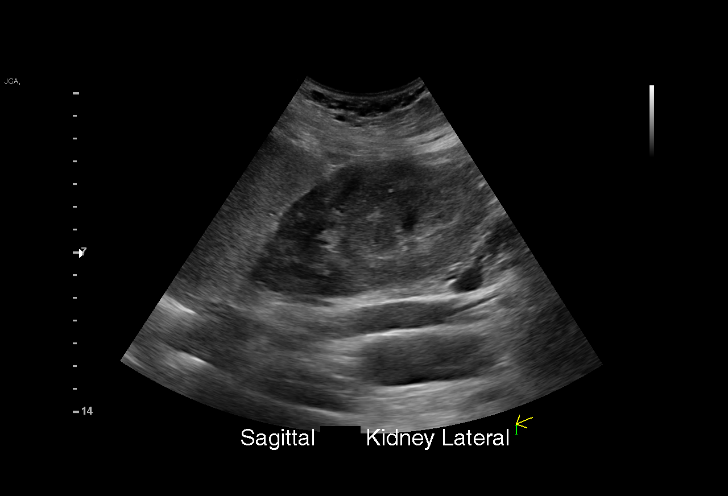
[im 52/73]
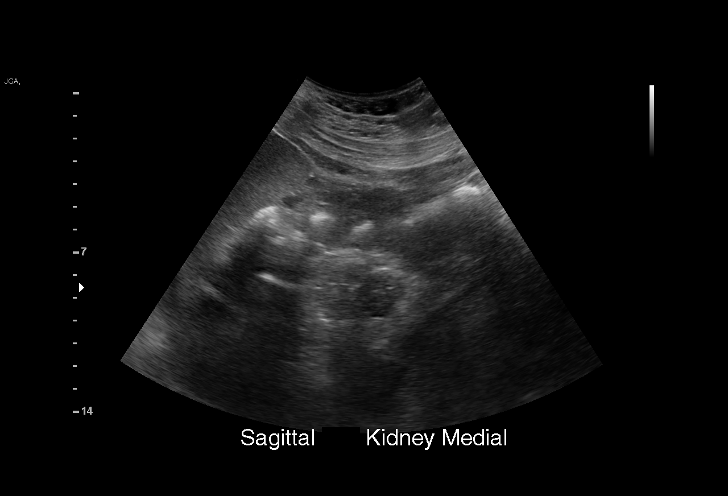
[im 58/73]
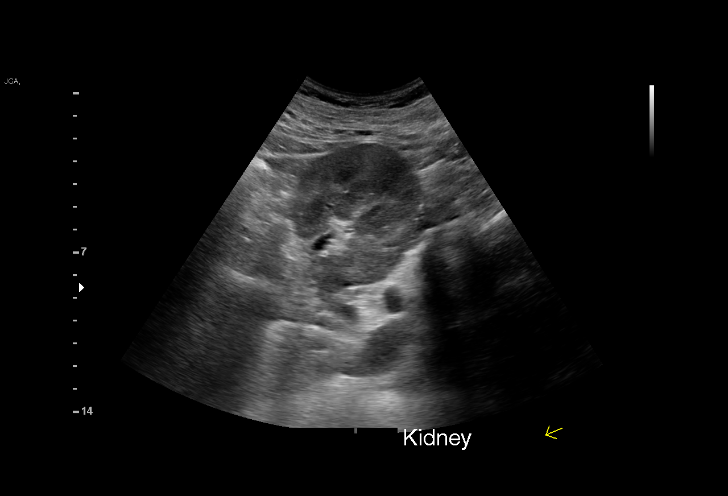
[im 61/73]
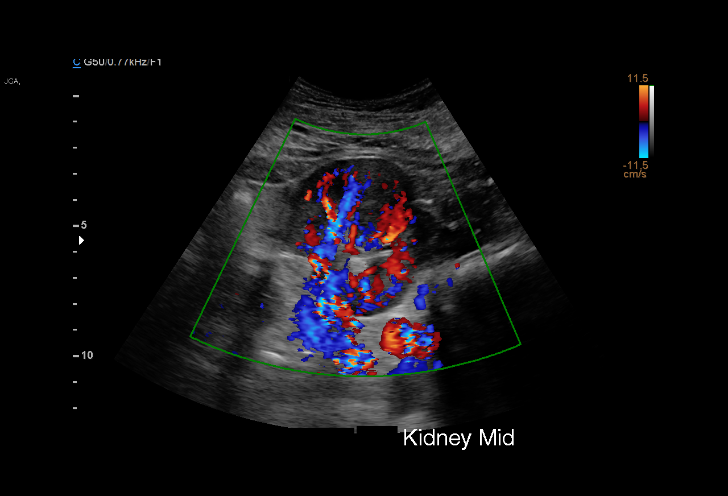
[im 67/73]
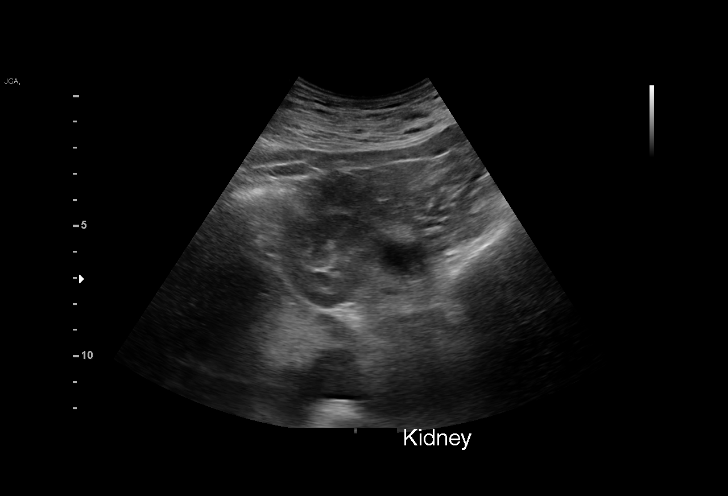
[im 73/73]
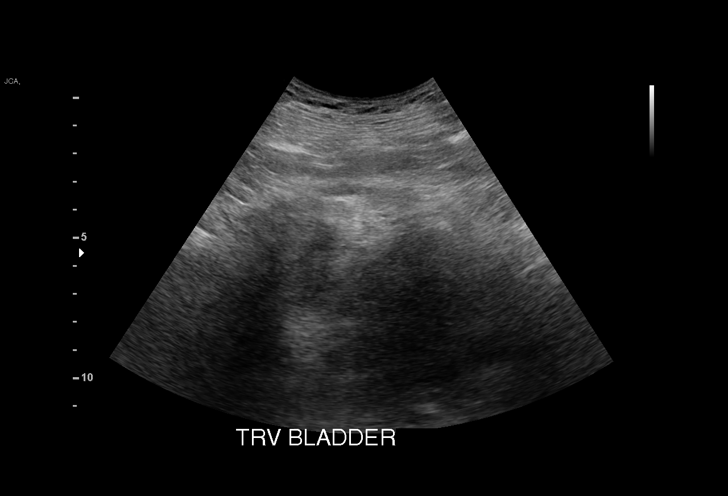

[15 of 25 positions shown; findings below may reference images not displayed]

FINDINGS: Right Kidney:

Renal measurements: 12.6 x 6.7 x 7.1 cm = volume: 314 mL. Slight
calyceal dilatation without frank hydronephrosis. Shadowing stone in
the renal pelvis measures 11 mm. Normal parenchymal echogenicity. No
focal lesion. No perinephric fluid collection.

Left Kidney:

Renal measurements: 13.5 x 6.2 x 5 cm = volume: 216 mL. Normal
parenchymal echogenicity. No hydronephrosis. No visualized stone or
focal lesion.

Bladder:

Partially distended with bladder volume of 134 cc. Appears normal
for degree of bladder distention. No postvoid residual.

Other:

Gravid uterus noted but not interrogated.
IMPRESSION: 1. Stone in the right renal pelvis measures 11 mm. Mild right
caliceal dilatation without frank hydronephrosis may be related to
pregnancy or intermittent obstruction secondary to stone.
2. Normal sonographic appearance of the left kidney.

## 2022-11-04 IMAGING — US US RENAL
1 series · 15 of 25 positions shown · non-contrast
Comparison: Similar study of 07/22/2021

CLINICAL DATA: Flank pain with known right renal pelvis stone.

EXAM:
RENAL / URINARY TRACT ULTRASOUND COMPLETE

[Series 1: us renal · 15 of 33 slices shown]
[im 1/33]
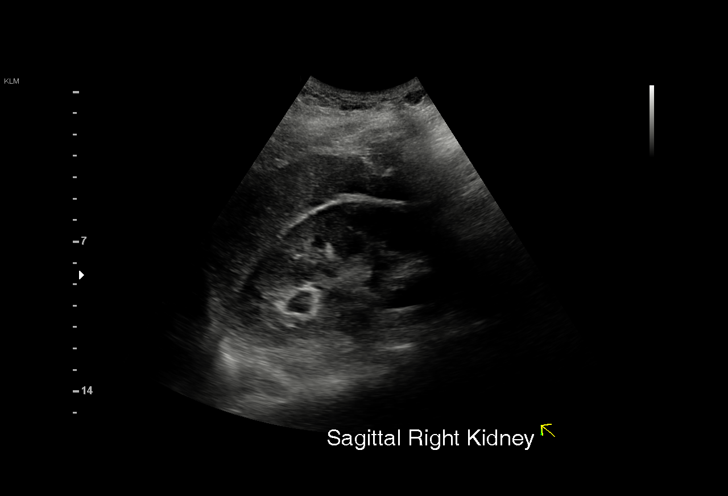
[im 3/33]
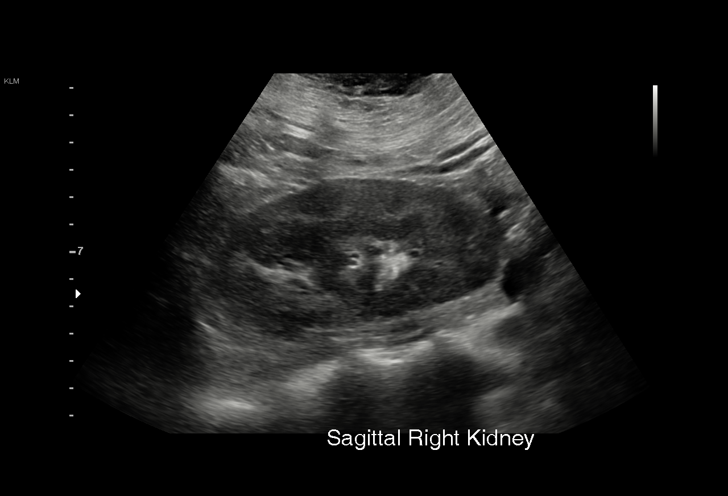
[im 6/33]
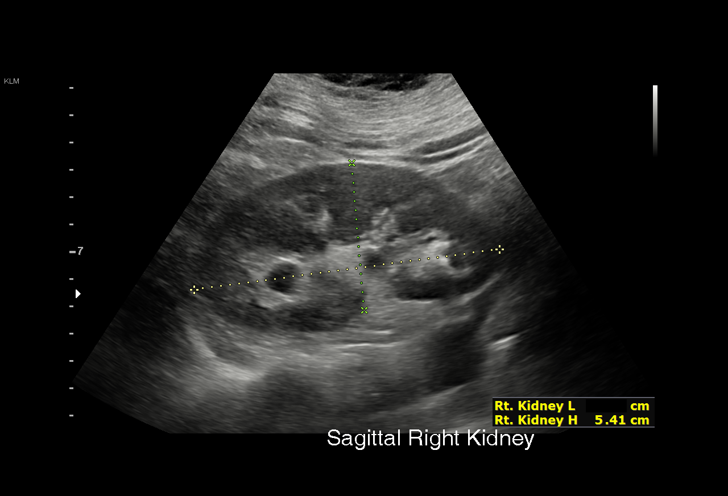
[im 7/33]
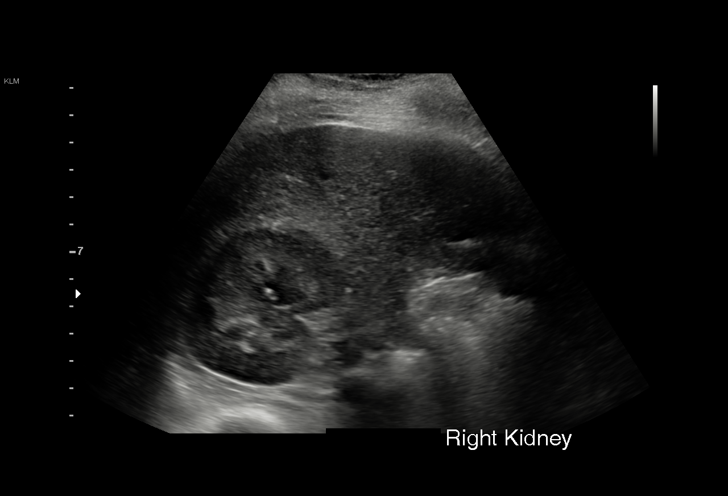
[im 10/33]
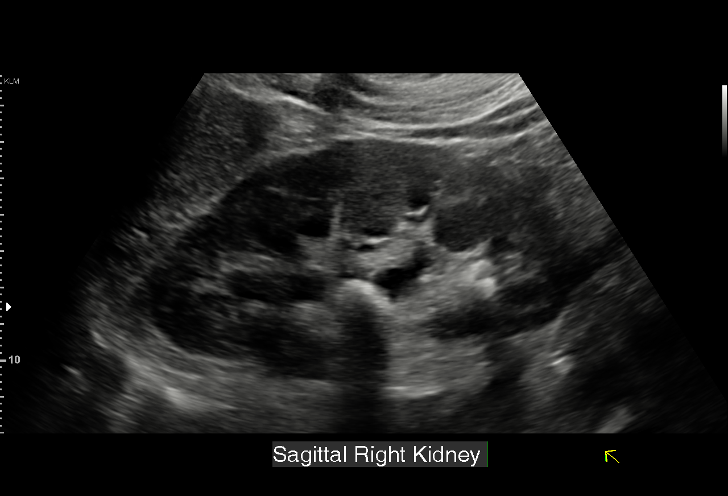
[im 13/33]
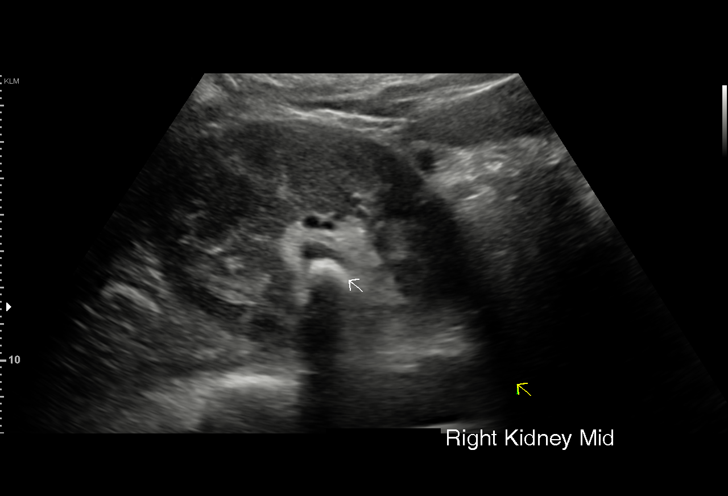
[im 14/33]
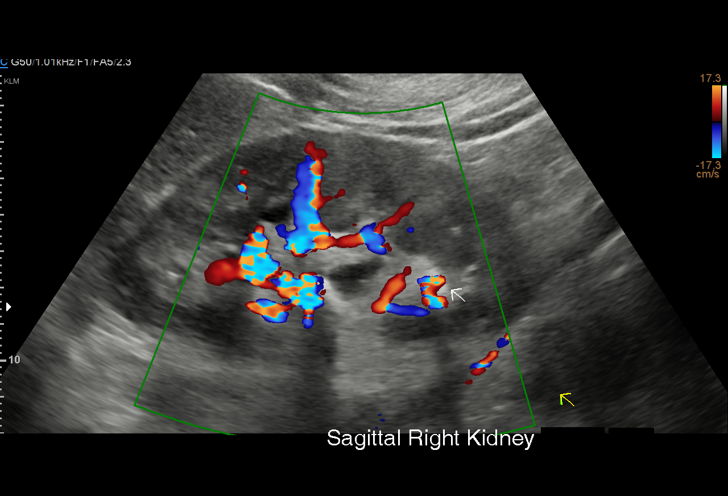
[im 17/33]
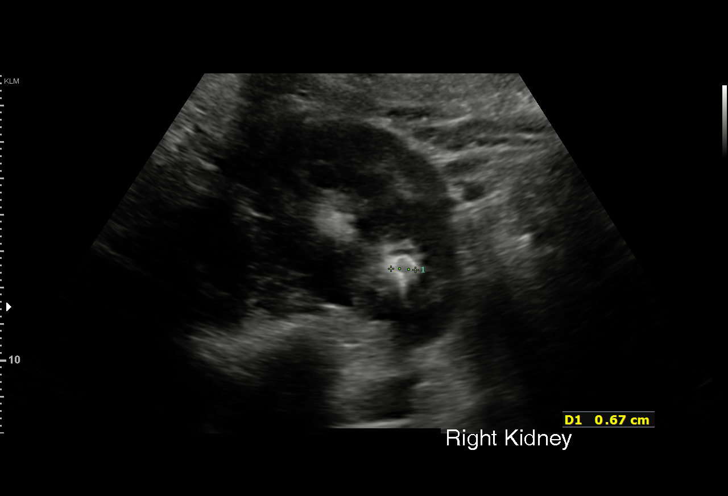
[im 19/33]
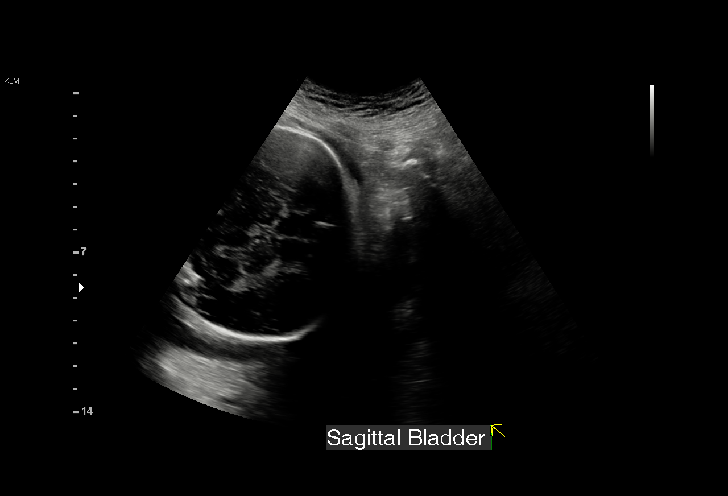
[im 21/33]
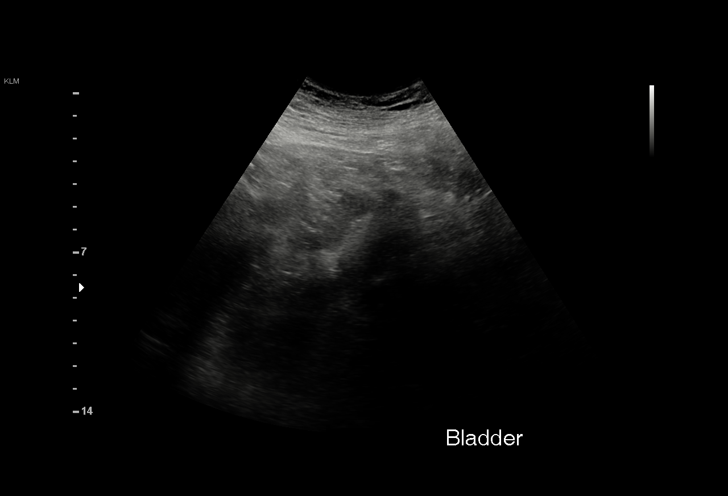
[im 23/33]
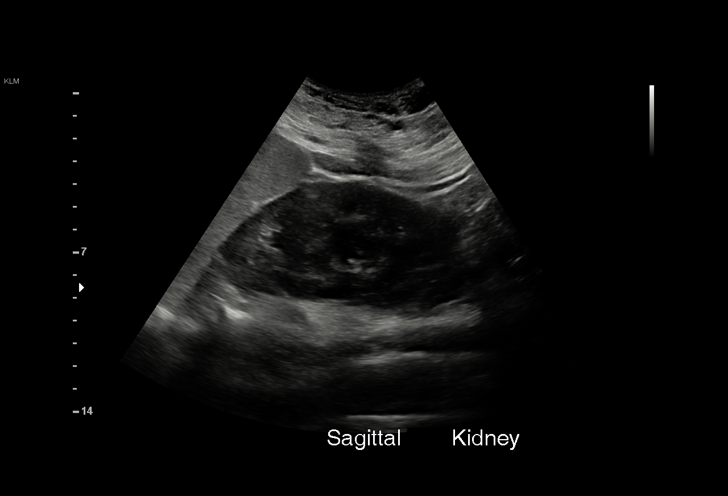
[im 26/33]
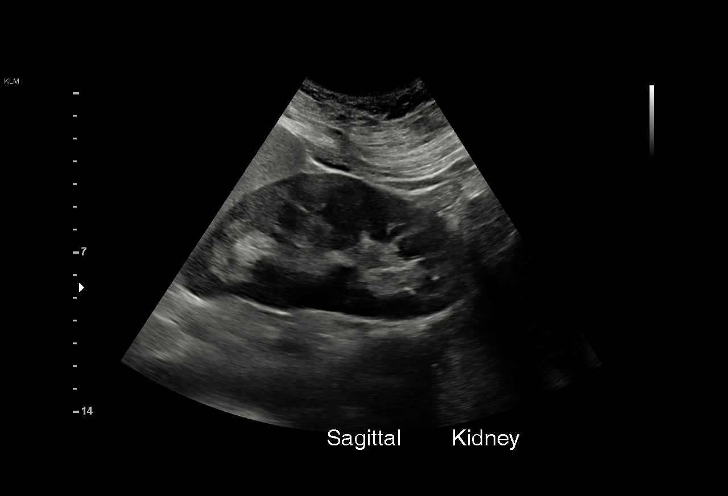
[im 27/33]
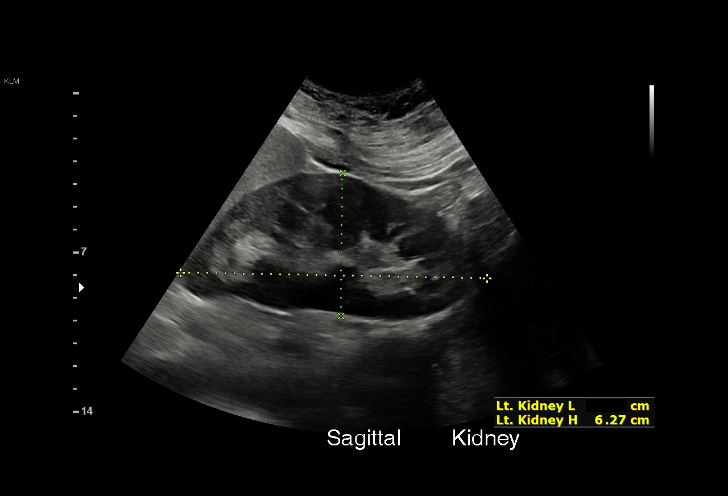
[im 30/33]
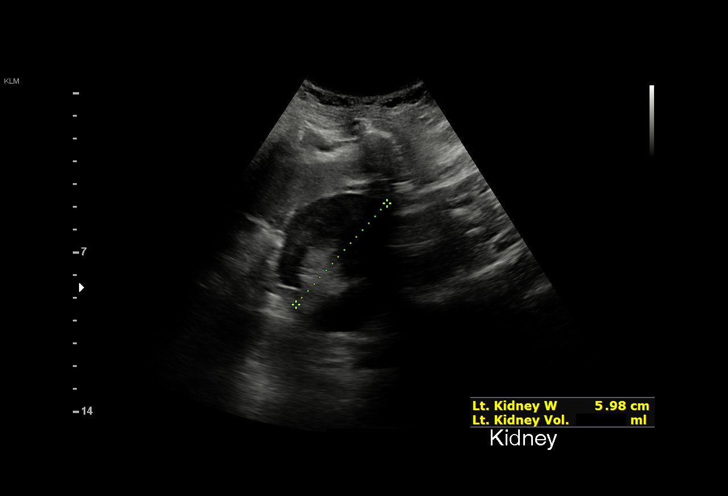
[im 33/33]
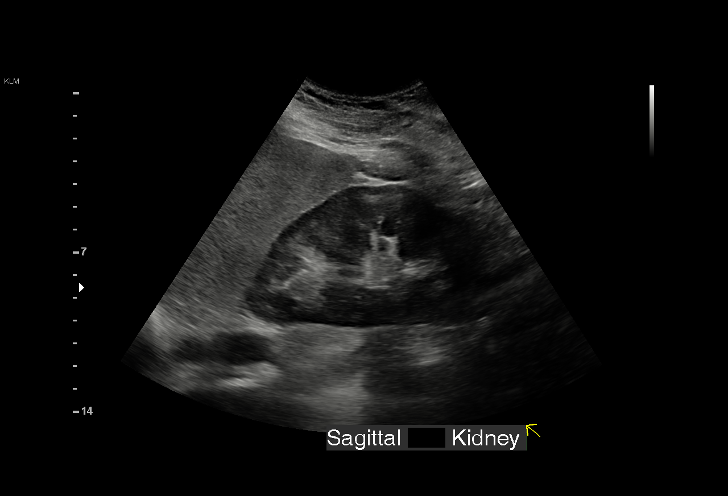

[15 of 25 positions shown; findings below may reference images not displayed]

FINDINGS: Right Kidney:

Renal measurements: 11.3 x 5.4 x 5.6 cm = volume: 179.9 mL,
previously 314 mL. Echogenicity within normal limits. No mass is
seen. Minimal hydronephrosis is again shown. Again noted is a 1.6 cm
stone in the renal pelvis and a 7 mm nonobstructive caliceal stone
in the inferior pole.

Left Kidney:

Renal measurements: 13.5 x 6.3 x 6.0 cm = volume: 264.1 mL,
previously 216 mL. Echogenicity within normal limits. No mass,
stones or hydronephrosis visualized.

Bladder:

Contracted and obscured by bowel gas at the time of imaging.

Other:

Gravid uterus again noted but not evaluated. The fetal head is
vertex presentation.
IMPRESSION: 1. 1.6 cm right renal pelvis stone with minimal hydronephrosis,
unchanged. Again could be related to pyelocaliectasis of pregnancy
or intermittent obstruction by the renal pelvis stone. Similar
findings previously.
2. Nonobstructive 7 mm caliceal stone in the inferior pole right
kidney.
3. Normal bilateral cortical thickness and echogenicity with no
significant findings on the left.
4. Contracted bladder, obscured by bowel gas at this time.

## 2023-02-16 ENCOUNTER — Ambulatory Visit: Payer: Medicaid Other | Admitting: Obstetrics and Gynecology

## 2023-02-17 ENCOUNTER — Encounter: Payer: Self-pay | Admitting: Obstetrics and Gynecology

## 2023-02-17 ENCOUNTER — Ambulatory Visit (INDEPENDENT_AMBULATORY_CARE_PROVIDER_SITE_OTHER): Payer: Medicaid Other | Admitting: Obstetrics and Gynecology

## 2023-02-17 VITALS — BP 116/75 | HR 89 | Ht 62.0 in | Wt 178.0 lb

## 2023-02-17 DIAGNOSIS — E282 Polycystic ovarian syndrome: Secondary | ICD-10-CM

## 2023-02-17 MED ORDER — SPIRONOLACTONE 50 MG PO TABS: 50.0000 mg | ORAL_TABLET | Freq: Two times a day (BID) | ORAL | 2 refills | Status: DC

## 2023-02-17 NOTE — Progress Notes (Signed)
GYNECOLOGY OFFICE VISIT NOTE  History:   Adrienne Madden is a 37 y.o. [phone] here today for recurrent ovarian cysts.  Discussed the use of AI scribe software for clinical note transcription with the patient, who gave verbal consent to proceed.  History of Present Illness   The patient, with a history of PCOS and insulin resistance, presents with recurrent ovarian cysts and other symptoms of PCOS, including facial hair growth. The patient underwent gastric sleeve surgery, which helped with some of the symptoms but did not fully resolve them. The patient has tried metformin in the past without success. The patient reports that her symptoms have been worsening, with a recent cyst causing severe pain to the point where she could not walk. The patient is interested in trying new medications that have worked for her friends, such as tirzepatide and semaglutide. The patient also has a history of kidney stones and is concerned about the potential diuretic effects of some medications.   She does not want to take anything hormonal due to side effects. She has never done Spironolactone. She does not have a PCP.    The following portions of the patient's history were reviewed and updated as appropriate: allergies, current medications, past family history, past medical history, past social history, past surgical history and problem list.   Health Maintenance:   06/04/2021: normal/HPV neg  Review of Systems:  Pertinent items noted in HPI and remainder of comprehensive ROS otherwise negative.  Physical Exam:  BP 116/75   Pulse 89   Ht 5\' 2"  (1.575 m)   Wt 178 lb (80.7 kg)   BMI 32.56 kg/m  CONSTITUTIONAL: Well-developed, well-nourished female in no acute distress.  HEENT:  Normocephalic, atraumatic. External right and left ear normal. No scleral icterus.  NECK: Normal range of motion, supple, no masses noted on observation SKIN: No rash noted. Not diaphoretic. No erythema. No  pallor. MUSCULOSKELETAL: Normal range of motion. No edema noted. NEUROLOGIC: Alert and oriented to person, place, and time. Normal muscle tone coordination. No cranial nerve deficit noted. PSYCHIATRIC: Normal mood and affect. Normal behavior. Normal judgment and thought content.  PELVIC: Deferred  Labs and Imaging No results found for this or any previous visit (from the past 168 hour(s)). No results found.  Assessment and Plan:   1. PCOS (polycystic ovarian syndrome) Persistent symptoms including ovarian cysts and hirsutism despite gastric sleeve surgery. Previous trial of metformin was unsuccessful. Patient expressed interest in tirzepatide and semaglutide based on friends' experiences, but these require primary care management as I don't routinely prescribe these and insurance approval. -Start Spironolactone to block testosterone-based symptoms and potentially help with ovarian cysts. -Consider over-the-counter supplement Ovasitol (myoinositol and dechiroinositol) to help with weight loss and PCOS symptoms. -Refer to primary care for potential initiation of tirzepatide or semaglutide if symptoms persist and patient is not satisfied with Spironolactone. - spironolactone (ALDACTONE) 50 MG tablet; Take 1 tablet (50 mg total) by mouth 2 (two) times daily. Will start with 50 mg twice daily, and increase to 100 mg twice daily as needed  Dispense: 60 tablet; Refill: 2 - Ambulatory referral to [facility]   General Health Maintenance -Refer to primary care for ongoing management of Insulin resistance and potential initiation of tirzepatide or semaglutide.        Meds ordered this encounter  Medications   spironolactone (ALDACTONE) 50 MG tablet    Sig: Take 1 tablet (50 mg total) by mouth 2 (two) times daily. Will start with 50 mg twice  daily, and increase to 100 mg twice daily as needed    Dispense:  60 tablet    Refill:  2     Routine preventative health maintenance measures  emphasized. Please refer to After Visit Summary for other counseling recommendations.   Return in about 1 year (around 02/17/2024) for annual.  Milas Hock, MD, FACOG Obstetrician & Gynecologist, [facility] for [employer], [facility]

## 2023-02-17 NOTE — Progress Notes (Signed)
Left sided pelvic pain

## 2023-02-17 NOTE — Patient Instructions (Signed)
Ovasitol

## 2023-03-31 ENCOUNTER — Ambulatory Visit: Payer: Medicaid Other | Admitting: Family Medicine

## 2023-03-31 DIAGNOSIS — E66811 Obesity, class 1: Secondary | ICD-10-CM | POA: Insufficient documentation

## 2023-03-31 DIAGNOSIS — E6609 Other obesity due to excess calories: Secondary | ICD-10-CM | POA: Insufficient documentation

## 2023-03-31 NOTE — Progress Notes (Deleted)
     New patient visit   Patient: Adrienne Madden   DOB: [birth year]   37 y.o. Female  MRN: [id number] Visit Date: 03/31/2023  Today's healthcare provider: Charlton Amor, DO   No chief complaint on file.   SUBJECTIVE   No chief complaint on file.  HPI   Pt presents to establish care. She has a history of PCOS and is interested in weight loss medication. Is currently on spironolactone from OBGYN office.    Review of Systems  Constitutional:  Negative for activity change, fatigue and fever.  Respiratory:  Negative for cough and shortness of breath.   Cardiovascular:  Negative for chest pain.  Gastrointestinal:  Negative for abdominal pain.  Genitourinary:  Negative for difficulty urinating.       No outpatient medications have been marked as taking for the 03/31/23 encounter (Appointment) with Charlton Amor, DO.    OBJECTIVE    There were no vitals taken for this visit.  Physical Exam Vitals and nursing note reviewed.  Constitutional:      General: She is not in acute distress.    Appearance: Normal appearance.  HENT:     Head: Normocephalic and atraumatic.     Right Ear: External ear normal.     Left Ear: External ear normal.     Nose: Nose normal.  Eyes:     Conjunctiva/sclera: Conjunctivae normal.  Cardiovascular:     Rate and Rhythm: Normal rate and regular rhythm.  Pulmonary:     Effort: Pulmonary effort is normal.     Breath sounds: Normal breath sounds.  Neurological:     General: No focal deficit present.     Mental Status: She is alert and oriented to person, place, and time.  Psychiatric:        Mood and Affect: Mood normal.        Behavior: Behavior normal.        Thought Content: Thought content normal.        Judgment: Judgment normal.        ASSESSMENT & PLAN    Problem List Items Addressed This Visit   None   No follow-ups on file.      No orders of the defined types were placed in this encounter.   No orders of the defined  types were placed in this encounter.    Charlton Amor, DO  [facility] at [facility] [phone] (phone) [phone] (fax)  [facility]

## 2023-05-10 NOTE — Progress Notes (Deleted)
     New patient visit   Patient: Adrienne Madden   DOB: 03/22/1986   37 y.o. Female  MRN: [id number] Visit Date: 05/11/2023  Today's healthcare provider: Charlton Amor, DO   No chief complaint on file.   SUBJECTIVE   No chief complaint on file.  HPI  Pt presents to establish care. Has hx of PCOS  Review of Systems     No outpatient medications have been marked as taking for the 05/11/23 encounter (Appointment) with Charlton Amor, DO.    OBJECTIVE    There were no vitals taken for this visit.  Physical Exam     ASSESSMENT & PLAN    Problem List Items Addressed This Visit   None   No follow-ups on file.      No orders of the defined types were placed in this encounter.   No orders of the defined types were placed in this encounter.    Charlton Amor, DO  [facility] at [facility] [phone] (phone) [phone] (fax)  [facility]

## 2023-05-11 ENCOUNTER — Ambulatory Visit: Payer: Medicaid Other | Admitting: Family Medicine

## 2023-05-11 ENCOUNTER — Ambulatory Visit (INDEPENDENT_AMBULATORY_CARE_PROVIDER_SITE_OTHER): Payer: Medicaid Other | Admitting: Family Medicine

## 2023-05-11 ENCOUNTER — Telehealth: Payer: Self-pay | Admitting: Family Medicine

## 2023-05-11 ENCOUNTER — Encounter: Payer: Self-pay | Admitting: Family Medicine

## 2023-05-11 VITALS — BP 116/79 | HR 77 | Temp 98.1°F | Resp 18 | Ht 62.0 in | Wt 183.5 lb

## 2023-05-11 DIAGNOSIS — Z7689 Persons encountering health services in other specified circumstances: Secondary | ICD-10-CM | POA: Diagnosis not present

## 2023-05-11 DIAGNOSIS — E88819 Insulin resistance, unspecified: Secondary | ICD-10-CM

## 2023-05-11 DIAGNOSIS — E282 Polycystic ovarian syndrome: Secondary | ICD-10-CM

## 2023-05-11 DIAGNOSIS — E66811 Obesity, class 1: Secondary | ICD-10-CM

## 2023-05-11 MED ORDER — SEMAGLUTIDE(0.25 OR 0.5MG/DOS) 2 MG/1.5ML ~~LOC~~ SOPN: 0.2500 mg | PEN_INJECTOR | SUBCUTANEOUS | 0 refills | Status: DC

## 2023-05-11 NOTE — Patient Instructions (Signed)
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
## 2023-05-11 NOTE — Progress Notes (Signed)
New Patient Office Visit  Subjective    Patient ID: Adrienne Madden, female    DOB: 1985/08/04  Age: 38 y.o. MRN: [id number]  CC:  Chief Complaint  Patient presents with   Establish Care    Patient is here to establish care with a new PCP    HPI Adrienne Madden presents to establish care with this practice. She is new to me. History of PCOS and insulin resistance. She was referred per GYN to primary care for initiation of GLP-1 medication as she has not responded to metformin or spironolactone for her PCOS and insulin resistance. She is continuing to take spironolactone 50 mg daily.  She understands this semaglutide will need prior authorization before she can start.   Up to date with pap smear: 06/04/21: due 2028.     Outpatient Encounter Medications as of 05/11/2023  Medication Sig   levonorgestrel (MIRENA) 20 MCG/DAY IUD 1 each by Intrauterine route once.   Multiple Vitamins-Minerals (MULTIVITAMIN WITH MINERALS) tablet Take 1 tablet by mouth daily.   Semaglutide,0.25 or 0.5MG /DOS, 2 MG/1.5ML SOPN Inject 0.25 mg into the skin once a week.   spironolactone (ALDACTONE) 50 MG tablet Take 1 tablet (50 mg total) by mouth 2 (two) times daily. Will start with 50 mg twice daily, and increase to 100 mg twice daily as needed   No facility-administered encounter medications on file as of 05/11/2023.    Past Medical History:  Diagnosis Date   Anxiety    Chlamydia 2011   Concussion 02/2016   History of kidney stones    History of pre-eclampsia    HSV (herpes simplex virus) infection    IBS (irritable bowel syndrome)    no meds   Insulin resistance    Kidney stone complicating pregnancy    Obesity    Polycystic ovarian syndrome     Past Surgical History:  Procedure Laterality Date   BARIATRIC SURGERY     BREAST ENHANCEMENT SURGERY     EXTRACORPOREAL SHOCK WAVE LITHOTRIPSY Right 08/10/2016   Procedure: RIGHT EXTRACORPOREAL SHOCK WAVE LITHOTRIPSY (ESWL);  Surgeon: Hildred Laser, MD;  Location: [facility];  Service: Urology;  Laterality: Right;   FINGER SURGERY Right    rt hand cyst   LAPAROSCOPIC OVARIAN CYSTECTOMY Left 10/13/2016   Procedure: LAPAROSCOPIC OVARIAN CYSTECTOMY - LEFT DERMOID CYST;  Surgeon: Allie Bossier, MD;  Location: [facility];  Service: Gynecology;  Laterality: Left;   LITHOTRIPSY  2015   WISDOM TOOTH EXTRACTION      Family History  Problem Relation Age of Onset   Diabetes Maternal Grandmother    Diabetes Paternal Grandmother    Cancer Paternal Grandmother        lung/bile duct   Cancer Paternal Grandfather        bile duct    Social History   Socioeconomic History   Marital status: Single    Spouse name: Not on file   Number of children: 2   Years of education: Not on file   Highest education level: Professional school degree (e.g., MD, DDS, DVM, JD)  Occupational History   Occupation: Social research officer, government  Tobacco Use   Smoking status: Never    Passive exposure: Never   Smokeless tobacco: Never  Vaping Use   Vaping status: Never Used  Substance and Sexual Activity   Alcohol use: No    Alcohol/week: 1.0 standard drink of alcohol    Types: 1 Standard drinks or equivalent per week   Drug use: No  Sexual activity: Yes    Partners: Male    Birth control/protection: I.U.D.  Other Topics Concern   Not on file  Social History Narrative   Not on file   Social Drivers of Health   Financial Resource Strain: Medium Risk (05/10/2023)   Overall Financial Resource Strain (CARDIA)    Difficulty of Paying Living Expenses: Somewhat hard  Food Insecurity: No Food Insecurity (05/10/2023)   Hunger Vital Sign    Worried About Running Out of Food in the Last Year: Never true    Ran Out of Food in the Last Year: Never true  Transportation Needs: No Transportation Needs (05/10/2023)   PRAPARE - Administrator, Civil Service (Medical): No    Lack of Transportation (Non-Medical): No  Physical Activity: Insufficiently Active  (05/10/2023)   Exercise Vital Sign    Days of Exercise per Week: 4 days    Minutes of Exercise per Session: 30 min  Stress: Stress Concern Present (05/10/2023)   [employer] of Occupational Health - Occupational Stress Questionnaire    Feeling of Stress : To some extent  Social Connections: Moderately Integrated (05/10/2023)   Social Connection and Isolation Panel [NHANES]    Frequency of Communication with Friends and Family: More than three times a week    Frequency of Social Gatherings with Friends and Family: Once a week    Attends Religious Services: More than 4 times per year    Active Member of [employer] or Organizations: Yes    Attends Banker Meetings: More than 4 times per year    Marital Status: Divorced  Intimate Partner Violence: Unknown (07/15/2021)   Received from [employer], [facility]   HITS    Physically Hurt: Not on file    Insult or Talk Down To: Not on file    Threaten Physical Harm: Not on file    Scream or Curse: Not on file    ROS      Objective    BP 116/79   Pulse 77   Temp 98.1 F (36.7 C) (Oral)   Resp 18   Ht 5\' 2"  (1.575 m)   Wt 183 lb 8 oz (83.2 kg)   SpO2 97%   BMI 33.56 kg/m   Physical Exam Vitals and nursing note reviewed.  Constitutional:      General: She is not in acute distress.    Appearance: Normal appearance. She is obese. She is not ill-appearing.  Cardiovascular:     Rate and Rhythm: Normal rate and regular rhythm.     Heart sounds: Normal heart sounds.  Pulmonary:     Effort: Pulmonary effort is normal.     Breath sounds: Normal breath sounds.  Skin:    General: Skin is warm and dry.  Neurological:     General: No focal deficit present.     Mental Status: She is alert. Mental status is at baseline.  Psychiatric:        Mood and Affect: Mood normal.        Behavior: Behavior normal.        Thought Content: Thought content normal.        Judgment: Judgment normal.       Flowsheet Row Office  Visit from [facility]/11/2023 in [facility] Total Score 1         05/11/2023    9:15 AM 07/15/2021    9:01 AM  GAD 7 : Generalized Anxiety Score  Nervous, Anxious, on Edge 0 1  Control/stop worrying 0 1  Worry too much - different things 0 0  Trouble relaxing 0 0  Restless 0 0  Easily annoyed or irritable 0 0  Afraid - awful might happen 0 0  Total GAD 7 Score 0 2  Anxiety Difficulty Somewhat difficult       Assessment & Plan:   Problem List Items Addressed This Visit     PCOS (polycystic ovarian syndrome)   Referred per GYN for initiation of GLP-1. Symptoms are not controlled on metformin or spironolactone therapy. Semaglutide 0.25 mg weekly ordered. Information provided on GLP-1 medications. Waiting on PA. Follow-up 4 weeks after starting medication.       Relevant Medications   Semaglutide,0.25 or 0.5MG /DOS, 2 MG/1.5ML SOPN   Establishing care with new doctor, encounter for - Primary   Insulin resistance, unspecified   Relevant Medications   Semaglutide,0.25 or 0.5MG /DOS, 2 MG/1.5ML SOPN  Agrees with plan of care discussed.  Questions answered.   Return in about 5 weeks (around 06/15/2023) for med management for Ozempic .   Novella Olive, FNP

## 2023-05-11 NOTE — Assessment & Plan Note (Addendum)
Referred per GYN for initiation of GLP-1. Symptoms are not controlled on metformin or spironolactone therapy. Semaglutide 0.25 mg weekly ordered. Information provided on GLP-1 medications. Waiting on PA. Follow-up 4 weeks after starting medication.

## 2023-05-13 ENCOUNTER — Telehealth: Payer: Self-pay

## 2023-05-13 NOTE — Telephone Encounter (Signed)
Prior auth for: OZEMPIC 0.25 MG Determination: Pending as of 05/13/23 Auth #: [id number] Valid from: n/a Patient notified via MyChart

## 2023-05-18 ENCOUNTER — Encounter: Payer: Self-pay | Admitting: Family Medicine

## 2023-06-18 ENCOUNTER — Ambulatory Visit: Payer: Medicaid Other | Admitting: Family Medicine

## 2023-06-24 DIAGNOSIS — D270 Benign neoplasm of right ovary: Secondary | ICD-10-CM | POA: Insufficient documentation

## 2023-06-24 NOTE — Progress Notes (Signed)
 GYNECOLOGY OFFICE VISIT NOTE  History:   Jessenia Filippone is a 38 y.o. [phone] here today for follow up from CT done at [facility].   Has had right dermoid since 2020 upon review of their imaging. Is about 5 cm in size. Now CT shows abutting right adnexal mass. MRI was recommended for the new mass. This was not present on last CT but that was in 2020. No interval imaging since that time.  On CT, report notes no enlarged LN or free fluid which is reassuring.   She has a history of a left dermoid which was removed in 2018 by Dr. Marice Potter.   She denies any abnormal vaginal discharge, bleeding, pelvic pain or other concerns.     Past Medical History:  Diagnosis Date   Anxiety    Chlamydia 2011   Concussion 02/2016   History of kidney stones    History of pre-eclampsia    HSV (herpes simplex virus) infection    IBS (irritable bowel syndrome)    no meds   Insulin resistance    Kidney stone complicating pregnancy    Obesity    Polycystic ovarian syndrome     Past Surgical History:  Procedure Laterality Date   BARIATRIC SURGERY     BREAST ENHANCEMENT SURGERY     EXTRACORPOREAL SHOCK WAVE LITHOTRIPSY Right 08/10/2016   Procedure: RIGHT EXTRACORPOREAL SHOCK WAVE LITHOTRIPSY (ESWL);  Surgeon: Hildred Laser, MD;  Location: [facility];  Service: Urology;  Laterality: Right;   FINGER SURGERY Right    rt hand cyst   LAPAROSCOPIC OVARIAN CYSTECTOMY Left 10/13/2016   Procedure: LAPAROSCOPIC OVARIAN CYSTECTOMY - LEFT DERMOID CYST;  Surgeon: Allie Bossier, MD;  Location: [facility];  Service: Gynecology;  Laterality: Left;   LITHOTRIPSY  2015   WISDOM TOOTH EXTRACTION      The following portions of the patient's history were reviewed and updated as appropriate: allergies, current medications, past family history, past medical history, past social history, past surgical history and problem list.   Review of Systems:  Pertinent items noted in HPI and remainder of comprehensive ROS otherwise  negative.  Physical Exam:  BP 113/69   Pulse 69   Ht 5\' 2"  (1.575 m)   Wt 180 lb (81.6 kg)   BMI 32.92 kg/m  CONSTITUTIONAL: Well-developed, well-nourished female in no acute distress.  HEENT:  Normocephalic, atraumatic. External right and left ear normal. No scleral icterus.  NECK: Normal range of motion, supple, no masses noted on observation SKIN: No rash noted. Not diaphoretic. No erythema. No pallor. MUSCULOSKELETAL: Normal range of motion. No edema noted. NEUROLOGIC: Alert and oriented to person, place, and time. Normal muscle tone coordination. No cranial nerve deficit noted. PSYCHIATRIC: Normal mood and affect. Normal behavior. Normal judgment and thought content.  PELVIC: Deferred  Labs and Imaging No results found for this or any previous visit (from the past week). No results found.  Assessment and Plan:   1. Dermoid cyst of right ovary (Primary) See below.   2. Adnexal mass Check tumor markers and check MRI for pre-surgical planning. Will have return for follow up after MRI to determine best course of action but ultimately we reviewed surgery is next step.  -     MR PELVIS W WO CONTRAST; Future -     Lactate dehydrogenase; Future -     Inhibin A -     Inhibin B -     Estradiol -     Beta hCG quant (ref lab) -  AFP tumor marker -     CA 125 -     Anti mullerian hormone   No orders of the defined types were placed in this encounter.    Routine preventative health maintenance measures emphasized. Please refer to After Visit Summary for other counseling recommendations.   Return for Follow up after MRI.  Milas Hock, MD, FACOG Obstetrician & Gynecologist, [facility] for [facility], [facility]

## 2023-06-28 ENCOUNTER — Ambulatory Visit: Admitting: Obstetrics and Gynecology

## 2023-06-28 ENCOUNTER — Encounter: Payer: Self-pay | Admitting: Obstetrics and Gynecology

## 2023-06-28 VITALS — BP 113/69 | HR 69 | Ht 62.0 in | Wt 180.0 lb

## 2023-06-28 DIAGNOSIS — N9489 Other specified conditions associated with female genital organs and menstrual cycle: Secondary | ICD-10-CM | POA: Diagnosis not present

## 2023-06-28 DIAGNOSIS — D270 Benign neoplasm of right ovary: Secondary | ICD-10-CM | POA: Diagnosis not present

## 2023-06-28 NOTE — Addendum Note (Signed)
 Addended by: Kathie Dike on: 06/28/2023 11:06 AM   Modules accepted: Orders

## 2023-07-06 LAB — CA 125: CA 125: 15 U/mL (ref <35)

## 2023-07-06 LAB — ANTI-MULLERIAN HORMONE (AMH), FEMALE: Anti-Mullerian Hormones(AMH), Female: 5.52 ng/mL (ref 0.36–10.07)

## 2023-07-06 LAB — INHIBIN A: Inhibin A: 5 pg/mL

## 2023-07-06 LAB — ESTRADIOL: Estradiol: 27 pg/mL

## 2023-07-06 LAB — HCG, QUANTITATIVE, PREGNANCY: HCG, Total, QN: <5 m[IU]/mL

## 2023-07-06 LAB — AFP TUMOR MARKER: AFP-Tumor Marker: 1.6 ng/mL

## 2023-07-06 LAB — INHIBIN B: Inhibin B: 86 pg/mL

## 2023-07-07 ENCOUNTER — Encounter: Payer: Self-pay | Admitting: Obstetrics and Gynecology

## 2023-08-06 NOTE — Telephone Encounter (Signed)
 Per provider's chart note - Ozempic  is not approved for treatment of PCOS. Patient has decided to pay OOP for the rx.

## 2023-08-08 ENCOUNTER — Encounter: Payer: Self-pay | Admitting: Obstetrics and Gynecology

## 2023-08-08 ENCOUNTER — Ambulatory Visit
Admission: RE | Admit: 2023-08-08 | Discharge: 2023-08-08 | Disposition: A | Discharge status: Home / Self Care | Source: Ambulatory Visit | Attending: Obstetrics and Gynecology

## 2023-08-08 DIAGNOSIS — D270 Benign neoplasm of right ovary: Secondary | ICD-10-CM

## 2023-08-08 DIAGNOSIS — N9489 Other specified conditions associated with female genital organs and menstrual cycle: Secondary | ICD-10-CM

## 2023-08-08 MED ORDER — GADOPICLENOL 0.5 MMOL/ML IV SOLN: 10.0000 mL | Freq: Once | INTRAVENOUS | Status: DC | PRN

## 2023-08-08 MED ORDER — GADOPICLENOL 0.5 MMOL/ML IV SOLN
8.0000 mL | Freq: Once | INTRAVENOUS | Status: AC | PRN
  Administered 2023-08-08: 10 mL via INTRAVENOUS

## 2023-08-12 ENCOUNTER — Other Ambulatory Visit: Payer: Self-pay | Admitting: Obstetrics and Gynecology

## 2023-08-12 DIAGNOSIS — N9489 Other specified conditions associated with female genital organs and menstrual cycle: Secondary | ICD-10-CM

## 2023-08-12 DIAGNOSIS — D270 Benign neoplasm of right ovary: Secondary | ICD-10-CM

## 2023-08-25 ENCOUNTER — Telehealth: Payer: Self-pay

## 2023-08-25 NOTE — Telephone Encounter (Signed)
 Patient called back to confirm she's available for surgery w/ Dr. Vallarie Gauze on 09/14/23 at 12:45 pm @[facility] . I provided pre-op instructions and surgery details by phone. Written details will be sent to patient's Mychart acct

## 2023-08-25 NOTE — Telephone Encounter (Signed)
 I called patient to see if she was available for surgery w/ Dr. Vallarie Gauze on 09/14/23 @[facility]  Main/12:45 pm. I left a detailed message and asked patient to call me to confirm [phone].

## 2023-09-07 ENCOUNTER — Encounter (HOSPITAL_COMMUNITY): Payer: Self-pay | Admitting: Obstetrics and Gynecology

## 2023-09-07 NOTE — Progress Notes (Signed)
 Spoke w/ via phone for pre-op interview--- Adrienne Madden Lab needs dos---- CBC, UPT and T&S per surgeon.        Lab results------ COVID test -----patient states asymptomatic no test needed Arrive at -------1050 NPO after MN NO Solid Food.  Clear liquids from MN until---0950 Pre-Surgery Ensure or G2:  Med rec completed Medications to take morning of surgery -----NONE Diabetic medication -----  GLP1 agonist last dose: Semaglutide  on medication list per patient she no longer takes this medication. GLP1 instructions:  Patient instructed no nail polish to be worn day of surgery Patient instructed to bring photo id and insurance card day of surgery Patient aware to have Driver (ride ) / caregiver    for 24 hours after surgery - Boyfriend [name] Patient Special Instructions ----- Shower with antibacterial soap. Pre-Op special Instructions -----  Patient verbalized understanding of instructions that were given at this phone interview. Patient denies chest pain, sob, fever, cough at the interview.

## 2023-09-14 ENCOUNTER — Ambulatory Visit (HOSPITAL_COMMUNITY): Admitting: Anesthesiology

## 2023-09-14 ENCOUNTER — Ambulatory Visit (HOSPITAL_COMMUNITY)
Admission: RE | Admit: 2023-09-14 | Discharge: 2023-09-14 | Disposition: A | Discharge status: Home / Self Care | Attending: Obstetrics and Gynecology | Admitting: Obstetrics and Gynecology

## 2023-09-14 ENCOUNTER — Encounter (HOSPITAL_COMMUNITY)
Admission: RE | Disposition: A | Discharge status: Home / Self Care | Payer: Self-pay | Source: Home / Self Care | Attending: Obstetrics and Gynecology

## 2023-09-14 ENCOUNTER — Other Ambulatory Visit: Payer: Self-pay

## 2023-09-14 ENCOUNTER — Encounter (HOSPITAL_COMMUNITY): Payer: Self-pay | Admitting: Obstetrics and Gynecology

## 2023-09-14 DIAGNOSIS — N83291 Other ovarian cyst, right side: Secondary | ICD-10-CM | POA: Diagnosis not present

## 2023-09-14 DIAGNOSIS — D270 Benign neoplasm of right ovary: Secondary | ICD-10-CM

## 2023-09-14 DIAGNOSIS — N9489 Other specified conditions associated with female genital organs and menstrual cycle: Secondary | ICD-10-CM

## 2023-09-14 HISTORY — DX: Other specified postprocedural states: Z98.890

## 2023-09-14 HISTORY — PX: LAPAROSCOPIC OVARIAN CYSTECTOMY: SHX6248

## 2023-09-14 LAB — CBC
HCT: 39.9 % (ref 36.0–46.0)
Hemoglobin: 13.5 g/dL (ref 12.0–15.0)
MCH: 28.7 pg (ref 26.0–34.0)
MCHC: 33.8 g/dL (ref 30.0–36.0)
MCV: 84.9 fL (ref 80.0–100.0)
Platelets: 342 10*3/uL (ref 150–400)
RBC: 4.7 MIL/uL (ref 3.87–5.11)
RDW: 12.5 % (ref 11.5–15.5)
WBC: 7.9 10*3/uL (ref 4.0–10.5)
nRBC: 0 % (ref 0.0–0.2)

## 2023-09-14 LAB — TYPE AND SCREEN
ABO/RH(D): O POS
Antibody Screen: NEGATIVE

## 2023-09-14 LAB — POCT PREGNANCY, URINE: Preg Test, Ur: NEGATIVE

## 2023-09-14 SURGERY — EXCISION, CYST, OVARY, LAPAROSCOPIC
Anesthesia: General | Site: Pelvis | Laterality: Right

## 2023-09-14 MED ORDER — IBUPROFEN 800 MG PO TABS: 800.0000 mg | ORAL_TABLET | Freq: Three times a day (TID) | ORAL | 0 refills | Status: AC | PRN

## 2023-09-14 MED ORDER — PHENYLEPHRINE 80 MCG/ML (10ML) SYRINGE FOR IV PUSH (FOR BLOOD PRESSURE SUPPORT)
PREFILLED_SYRINGE | INTRAVENOUS | Status: DC | PRN
  Administered 2023-09-14 (×2): 160 ug via INTRAVENOUS

## 2023-09-14 MED ORDER — PROPOFOL 10 MG/ML IV BOLUS
INTRAVENOUS | Status: AC
  Filled 2023-09-14: qty 20

## 2023-09-14 MED ORDER — SUGAMMADEX SODIUM 200 MG/2ML IV SOLN
INTRAVENOUS | Status: DC | PRN
  Administered 2023-09-14: 200 mg via INTRAVENOUS

## 2023-09-14 MED ORDER — DEXAMETHASONE SODIUM PHOSPHATE 10 MG/ML IJ SOLN
INTRAMUSCULAR | Status: AC
Start: 2023-09-14 — End: ?
  Filled 2023-09-14: qty 1

## 2023-09-14 MED ORDER — AMISULPRIDE (ANTIEMETIC) 5 MG/2ML IV SOLN
INTRAVENOUS | Status: AC
Start: 2023-09-14 — End: ?
  Filled 2023-09-14: qty 4

## 2023-09-14 MED ORDER — MIDAZOLAM HCL 2 MG/2ML IJ SOLN
INTRAMUSCULAR | Status: AC
Start: 2023-09-14 — End: ?
  Filled 2023-09-14: qty 2

## 2023-09-14 MED ORDER — BUPIVACAINE HCL (PF) 0.25 % IJ SOLN
INTRAMUSCULAR | Status: DC | PRN
  Administered 2023-09-14: 10 mL

## 2023-09-14 MED ORDER — FENTANYL CITRATE (PF) 250 MCG/5ML IJ SOLN
INTRAMUSCULAR | Status: DC | PRN
  Administered 2023-09-14: 50 ug via INTRAVENOUS
  Administered 2023-09-14: 100 ug via INTRAVENOUS
  Administered 2023-09-14 (×2): 50 ug via INTRAVENOUS

## 2023-09-14 MED ORDER — ONDANSETRON HCL 4 MG/2ML IJ SOLN
INTRAMUSCULAR | Status: DC | PRN
  Administered 2023-09-14: 4 mg via INTRAVENOUS

## 2023-09-14 MED ORDER — DEXAMETHASONE SODIUM PHOSPHATE 10 MG/ML IJ SOLN
INTRAMUSCULAR | Status: DC | PRN
  Administered 2023-09-14: 10 mg via INTRAVENOUS

## 2023-09-14 MED ORDER — LIDOCAINE 2% (20 MG/ML) 5 ML SYRINGE
INTRAMUSCULAR | Status: AC
  Filled 2023-09-14: qty 5

## 2023-09-14 MED ORDER — ROCURONIUM BROMIDE 10 MG/ML (PF) SYRINGE
PREFILLED_SYRINGE | INTRAVENOUS | Status: AC
  Filled 2023-09-14: qty 10

## 2023-09-14 MED ORDER — DEXMEDETOMIDINE HCL IN NACL 80 MCG/20ML IV SOLN
INTRAVENOUS | Status: DC | PRN
  Administered 2023-09-14: 12 ug via INTRAVENOUS

## 2023-09-14 MED ORDER — ONDANSETRON HCL 4 MG/2ML IJ SOLN: 4.0000 mg | Freq: Once | INTRAMUSCULAR | Status: DC | PRN

## 2023-09-14 MED ORDER — KETOROLAC TROMETHAMINE 30 MG/ML IJ SOLN
INTRAMUSCULAR | Status: AC
  Filled 2023-09-14: qty 1

## 2023-09-14 MED ORDER — POVIDONE-IODINE 10 % EX SWAB: 2.0000 | Freq: Once | CUTANEOUS | Status: DC

## 2023-09-14 MED ORDER — BUPIVACAINE HCL (PF) 0.25 % IJ SOLN
INTRAMUSCULAR | Status: AC
  Filled 2023-09-14: qty 30

## 2023-09-14 MED ORDER — LACTATED RINGERS IV SOLN
INTRAVENOUS | Status: DC
  Administered 2023-09-14: 1000 mL via INTRAVENOUS

## 2023-09-14 MED ORDER — ACETAMINOPHEN 500 MG PO TABS: 1000.0000 mg | ORAL_TABLET | ORAL | Status: DC

## 2023-09-14 MED ORDER — 0.9 % SODIUM CHLORIDE (POUR BTL) OPTIME
TOPICAL | Status: DC | PRN
  Administered 2023-09-14: 1000 mL

## 2023-09-14 MED ORDER — FENTANYL CITRATE (PF) 100 MCG/2ML IJ SOLN
INTRAMUSCULAR | Status: AC
  Filled 2023-09-14: qty 2

## 2023-09-14 MED ORDER — LACTATED RINGERS IV SOLN: INTRAVENOUS | Status: DC | PRN

## 2023-09-14 MED ORDER — SCOPOLAMINE 1 MG/3DAYS TD PT72: 1.0000 | MEDICATED_PATCH | TRANSDERMAL | Status: DC

## 2023-09-14 MED ORDER — HEMOSTATIC AGENTS (NO CHARGE) OPTIME
TOPICAL | Status: DC | PRN
  Administered 2023-09-14: 1 via TOPICAL

## 2023-09-14 MED ORDER — SCOPOLAMINE 1 MG/3DAYS TD PT72
1.0000 | MEDICATED_PATCH | Freq: Once | TRANSDERMAL | Status: DC
  Administered 2023-09-14: 1.5 mg via TRANSDERMAL

## 2023-09-14 MED ORDER — PROPOFOL 10 MG/ML IV BOLUS
INTRAVENOUS | Status: DC | PRN
  Administered 2023-09-14: 170 mg via INTRAVENOUS
  Administered 2023-09-14 (×2): 20 mg via INTRAVENOUS

## 2023-09-14 MED ORDER — SCOPOLAMINE 1 MG/3DAYS TD PT72
MEDICATED_PATCH | TRANSDERMAL | Status: DC
Start: 2023-09-14 — End: 2023-09-14
  Filled 2023-09-14: qty 1

## 2023-09-14 MED ORDER — MIDAZOLAM HCL 2 MG/2ML IJ SOLN
INTRAMUSCULAR | Status: DC | PRN
  Administered 2023-09-14: 2 mg via INTRAVENOUS

## 2023-09-14 MED ORDER — ORAL CARE MOUTH RINSE: 15.0000 mL | Freq: Once | OROMUCOSAL | Status: AC

## 2023-09-14 MED ORDER — ROCURONIUM BROMIDE 10 MG/ML (PF) SYRINGE
PREFILLED_SYRINGE | INTRAVENOUS | Status: DC | PRN
  Administered 2023-09-14: 60 mg via INTRAVENOUS
  Administered 2023-09-14 (×2): 10 mg via INTRAVENOUS

## 2023-09-14 MED ORDER — LIDOCAINE 2% (20 MG/ML) 5 ML SYRINGE
INTRAMUSCULAR | Status: DC | PRN
  Administered 2023-09-14: 80 mg via INTRAVENOUS

## 2023-09-14 MED ORDER — ACETAMINOPHEN 500 MG PO TABS
ORAL_TABLET | ORAL | Status: AC
  Filled 2023-09-14: qty 2

## 2023-09-14 MED ORDER — FENTANYL CITRATE (PF) 100 MCG/2ML IJ SOLN
25.0000 ug | INTRAMUSCULAR | Status: DC | PRN
  Administered 2023-09-14: 50 ug via INTRAVENOUS

## 2023-09-14 MED ORDER — ONDANSETRON 4 MG PO TBDP: 4.0000 mg | ORAL_TABLET | Freq: Four times a day (QID) | ORAL | 0 refills | Status: AC | PRN

## 2023-09-14 MED ORDER — FENTANYL CITRATE (PF) 250 MCG/5ML IJ SOLN
INTRAMUSCULAR | Status: AC
  Filled 2023-09-14: qty 5

## 2023-09-14 MED ORDER — OXYCODONE HCL 5 MG PO TABS: 5.0000 mg | ORAL_TABLET | ORAL | 0 refills | Status: AC | PRN

## 2023-09-14 MED ORDER — CHLORHEXIDINE GLUCONATE 0.12 % MT SOLN
OROMUCOSAL | Status: DC
Start: 2023-09-14 — End: 2023-09-14
  Filled 2023-09-14: qty 15

## 2023-09-14 MED ORDER — PHENYLEPHRINE HCL-NACL 20-0.9 MG/250ML-% IV SOLN: INTRAVENOUS | Status: DC | PRN

## 2023-09-14 MED ORDER — PHENYLEPHRINE 80 MCG/ML (10ML) SYRINGE FOR IV PUSH (FOR BLOOD PRESSURE SUPPORT)
PREFILLED_SYRINGE | INTRAVENOUS | Status: AC
Start: 2023-09-14 — End: ?
  Filled 2023-09-14: qty 10

## 2023-09-14 MED ORDER — AMISULPRIDE (ANTIEMETIC) 5 MG/2ML IV SOLN
10.0000 mg | Freq: Once | INTRAVENOUS | Status: AC | PRN
  Administered 2023-09-14: 10 mg via INTRAVENOUS

## 2023-09-14 MED ORDER — SODIUM CHLORIDE 0.9 % IR SOLN
Status: DC | PRN
  Administered 2023-09-14 (×3): 1000 mL

## 2023-09-14 MED ORDER — ONDANSETRON HCL 4 MG/2ML IJ SOLN
INTRAMUSCULAR | Status: AC
  Filled 2023-09-14: qty 2

## 2023-09-14 MED ORDER — DEXMEDETOMIDINE HCL IN NACL 80 MCG/20ML IV SOLN
INTRAVENOUS | Status: AC
  Filled 2023-09-14: qty 20

## 2023-09-14 MED ORDER — ACETAMINOPHEN 500 MG PO TABS
1000.0000 mg | ORAL_TABLET | Freq: Once | ORAL | Status: AC
  Administered 2023-09-14: 1000 mg via ORAL

## 2023-09-14 MED ORDER — KETOROLAC TROMETHAMINE 30 MG/ML IJ SOLN
INTRAMUSCULAR | Status: DC | PRN
  Administered 2023-09-14: 30 mg via INTRAVENOUS

## 2023-09-14 MED ORDER — CHLORHEXIDINE GLUCONATE 0.12 % MT SOLN
15.0000 mL | Freq: Once | OROMUCOSAL | Status: AC
  Administered 2023-09-14: 15 mL via OROMUCOSAL

## 2023-09-14 SURGICAL SUPPLY — 34 items
APPLICATOR ARISTA FLEXITIP XL (MISCELLANEOUS) IMPLANT
BAG COUNTER SPONGE SURGICOUNT (BAG) IMPLANT
BARRIER ADHS 3X4 INTERCEED (GAUZE/BANDAGES/DRESSINGS) IMPLANT
DERMABOND ADVANCED .7 DNX12 (GAUZE/BANDAGES/DRESSINGS) ×1 IMPLANT
DRAPE SURG IRRIG POUCH 19X23 (DRAPES) ×1 IMPLANT
DURAPREP 26ML APPLICATOR (WOUND CARE) ×1 IMPLANT
GLOVE BIO SURGEON STRL SZ 6 (GLOVE) ×1 IMPLANT
GLOVE BIOGEL PI IND STRL 7.0 (GLOVE) ×2 IMPLANT
GOWN STRL REUS W/ TWL LRG LVL3 (GOWN DISPOSABLE) ×2 IMPLANT
HEMOSTAT ARISTA ABSORB 3G PWDR (HEMOSTASIS) IMPLANT
IRRIGATION SUCT STRKRFLW 2 WTP (MISCELLANEOUS) IMPLANT
KIT PINK PAD W/HEAD ARE REST (MISCELLANEOUS) ×1 IMPLANT
KIT PINK PAD W/HEAD ARM REST (MISCELLANEOUS) ×1 IMPLANT
KIT TURNOVER KIT B (KITS) ×1 IMPLANT
NDL INSUFFLATION 14GA 120MM (NEEDLE) ×1 IMPLANT
NEEDLE INSUFFLATION 14GA 120MM (NEEDLE) ×1 IMPLANT
PACK LAPAROSCOPY BASIN (CUSTOM PROCEDURE TRAY) ×1 IMPLANT
PACK LAVH (CUSTOM PROCEDURE TRAY) IMPLANT
PAD POSITIONING PINK XL (MISCELLANEOUS) ×1 IMPLANT
POUCH LAPAROSCOPIC INSTRUMENT (MISCELLANEOUS) ×1 IMPLANT
SEALER TISSUE G2 CVD JAW 35 (ENDOMECHANICALS) ×1 IMPLANT
SHEARS HARMONIC 36 ACE (MISCELLANEOUS) IMPLANT
SLEEVE ADV FIXATION 5X100MM (TROCAR) ×1 IMPLANT
SUT VIC AB 4-0 PS2 18 (SUTURE) ×1 IMPLANT
SUT VICRYL 0 UR6 27IN ABS (SUTURE) ×1 IMPLANT
SYR 30ML LL (SYRINGE) IMPLANT
SYSTEM BAG RETRIEVAL 10MM (BASKET) IMPLANT
SYSTEM CARTER THOMASON II (TROCAR) IMPLANT
SYSTEM RETRIEVL 5MM INZII UNIV (BASKET) IMPLANT
TOWEL GREEN STERILE FF (TOWEL DISPOSABLE) ×2 IMPLANT
TRAY FOLEY W/BAG SLVR 14FR (SET/KITS/TRAYS/PACK) ×1 IMPLANT
TROCAR 11X100 Z THREAD (TROCAR) ×1 IMPLANT
TROCAR ADV FIXATION 5X100MM (TROCAR) ×1 IMPLANT
WARMER LAPAROSCOPE (MISCELLANEOUS) ×1 IMPLANT

## 2023-09-14 NOTE — H&P (Signed)
 [facility]st[facility] Gynecology Attending History and Physical  Adrienne Madden is a 38 y.o. [id number] who presented to office today for evaluation of right ovarian dermoid.   Has had right dermoid since 2020 upon review of their imaging. Is about 5 cm in size. Now CT shows abutting right adnexal mass. MRI was recommended for the new mass. This was not present on last CT but that was in 2020. No interval imaging since that time.  On CT, report notes no enlarged LN or free fluid which is reassuring. MRI was done showing 5 cm dermoid and then 2 other ovarian cysts that are simple in appearance, largest measuring 6 cm.    She has a history of a left dermoid which was removed in 2018 by Dr. Everardo Hitch.  Denies any abnormal vaginal discharge, fevers, chills, sweats, dysuria, nausea, vomiting, other GI or GU symptoms or other general symptoms.  Past Medical History:  Diagnosis Date   Anxiety    Chlamydia 2011   Concussion 02/2016   History of kidney stones    History of pre-eclampsia    HSV (herpes simplex virus) infection    IBS (irritable bowel syndrome)    no meds   Insulin resistance    Kidney stone complicating pregnancy    Obesity    Polycystic ovarian syndrome    PONV (postoperative nausea and vomiting)    Past Surgical History:  Procedure Laterality Date   BARIATRIC SURGERY     BREAST ENHANCEMENT SURGERY     EXTRACORPOREAL SHOCK WAVE LITHOTRIPSY Right 08/10/2016   Procedure: RIGHT EXTRACORPOREAL SHOCK WAVE LITHOTRIPSY (ESWL);  Surgeon: Bart Born, MD;  Location: [facility];  Service: Urology;  Laterality: Right;   FINGER SURGERY Right    rt hand cyst   LAPAROSCOPIC OVARIAN CYSTECTOMY Left 10/13/2016   Procedure: LAPAROSCOPIC OVARIAN CYSTECTOMY - LEFT DERMOID CYST;  Surgeon: Ana Balling, MD;  Location: [facility];  Service: Gynecology;  Laterality: Left;   LITHOTRIPSY  2015   WISDOM TOOTH EXTRACTION     OB History  Gravida Para Term Preterm AB Living  2 2 2  0 0 2  SAB  IAB Ectopic Multiple Live Births  0 0 0 0 2    # Outcome Date GA Lbr Len/2nd Weight Sex Type Anes PTL Lv  2 Term 09/05/21 [redacted]w[redacted]d 19:11 / 00:18 3480 g M Vag-Spont EPI  LIV  1 Term 07/22/17 [redacted]w[redacted]d 33:06 / 04:10 3465 g M Vag-Spont EPI  LIV  Patient denies any other pertinent gynecologic issues.  No current facility-administered medications on file prior to encounter.   Current Outpatient Medications on File Prior to Encounter  Medication Sig Dispense Refill   loratadine (CLARITIN) 10 MG tablet Take 10 mg by mouth daily.     Multiple Vitamins-Minerals (MULTIVITAMIN WITH MINERALS) tablet Take 1 tablet by mouth daily.     Semaglutide ,0.25 or 0.5MG /DOS, 2 MG/1.5ML SOPN Inject 0.25 mg into the skin once a week. 1.5 mL 0   levonorgestrel  (MIRENA ) 20 MCG/DAY IUD 1 each by Intrauterine route once.     spironolactone  (ALDACTONE ) 50 MG tablet Take 1 tablet (50 mg total) by mouth 2 (two) times daily. Will start with 50 mg twice daily, and increase to 100 mg twice daily as needed (Patient not taking: Reported on 09/07/2023) 60 tablet 2   Allergies  Allergen Reactions   Hydrocodone  Nausea And Vomiting   Lactose Intolerance (Gi) Diarrhea    Social History:   reports that she has never smoked. She has  never been exposed to tobacco smoke. She has never used smokeless tobacco. She reports that she does not drink alcohol and does not use drugs. Family History  Problem Relation Age of Onset   Diabetes Maternal Grandmother    Diabetes Paternal Grandmother    Cancer Paternal Grandmother        lung/bile duct   Cancer Paternal Grandfather        bile duct    Review of Systems: Pertinent items noted in HPI and remainder of comprehensive ROS otherwise negative.  PHYSICAL EXAM: Blood pressure 116/73, pulse 77, temperature 98 F (36.7 C), temperature source Oral, resp. rate 16, height 5\' 4"  (1.626 m), weight 75.3 kg, SpO2 95%, not currently breastfeeding. CONSTITUTIONAL: Well-developed, well-nourished female  in no acute distress.  HENT:  Normocephalic, atraumatic, External right and left ear normal. Oropharynx is clear and moist EYES: Conjunctivae and EOM are normal. Pupils are equal, round, and reactive to light. No scleral icterus.  NECK: Normal range of motion, supple, no masses SKIN: Skin is warm and dry. No rash noted. Not diaphoretic. No erythema. No pallor. NEUROLOGIC: Alert and oriented to person, place, and time. Normal reflexes, muscle tone coordination. No cranial nerve deficit noted. PSYCHIATRIC: Normal mood and affect. Normal behavior. Normal judgment and thought content. CARDIOVASCULAR: Normal heart rate noted, regular rhythm RESPIRATORY: Effort and breath sounds normal, no problems with respiration noted ABDOMEN: Soft, nontender, nondistended. PELVIC: Not examined MUSCULOSKELETAL: Normal range of motion. No tenderness.  No cyanosis, clubbing, or edema.  2+ distal pulses.  Labs: Results for orders placed or performed during the hospital encounter of 09/14/23 (from the past 2 weeks)  Pregnancy, urine POC   Collection Time: 09/14/23 11:31 AM  Result Value Ref Range   Preg Test, Ur NEGATIVE NEGATIVE  CBC   Collection Time: 09/14/23 11:33 AM  Result Value Ref Range   WBC 7.9 4.0 - 10.5 K/uL   RBC 4.70 3.87 - 5.11 MIL/uL   Hemoglobin 13.5 12.0 - 15.0 g/dL   HCT 16.1 09.6 - 04.5 %   MCV 84.9 80.0 - 100.0 fL   MCH 28.7 26.0 - 34.0 pg   MCHC 33.8 30.0 - 36.0 g/dL   RDW 40.9 81.1 - 91.4 %   Platelets 342 150 - 400 K/uL   nRBC 0.0 0.0 - 0.2 %  Type and screen   Collection Time: 09/14/23 12:04 PM  Result Value Ref Range   ABO/RH(D) O POS    Antibody Screen NEG    Sample Expiration      09/17/2023,2359 Performed at [facility], [address] [place], [place] [address]     Imaging Studies: Recent MRI from 4/27: IMPRESSION: 1. The right ovary is enlarged by multiple thinly septated cysts as well as a mixed solid and cystic lesion containing  predominantly macroscopic fat, consistent with a combination of functional ovarian cysts and a benign dermoid. Overall dimensions of the ovary are 10.4 x 8.0 x 6.5 cm. Fat containing mass measures 4.9 x 4.2 x 4.5 cm. Largest individual cyst measures 6.6 x 5.2 x 5.9 cm. 2. Normal left ovary with numerous functional follicles. 3. Prominent bilateral uterine and adnexal varices, which may be seen in the setting of pelvic congestion if referable signs and symptoms are present. 4. IUD in the fundal endometrial cavity.  Assessment: Active Problems:   Dermoid cyst of right ovary   Plan: - Diagnosis: right ovarian dermoid and cysts  - Planned surgery: L/S ovarian cystectomy  - Risks of  surgery include but are not limited to: bleeding, infection, injury to surrounding organs/tissues (i.e. bowel/bladder/ureters), need for additional procedures, wound complications, hospital re-admission, and conversion to open surgery, VTE  - We discussed postop restrictions, precautions and expectations  - Preop testing needed: None  - All questions answered    Lacey Pian, MD, FACOG Obstetrician & Gynecologist, [facility] for [facility], [facility]

## 2023-09-14 NOTE — Op Note (Signed)
 Adrienne Madden PROCEDURE DATE: 09/14/2023   PREOPERATIVE DIAGNOSIS:  Right ovarian dermoid and cysts  POSTOPERATIVE DIAGNOSIS:  Same  PROCEDURE:  Laparoscopic right ovarian cystectomy   SURGEON:  Dr. Lacey Pian  ASSISTANT:  Dr. Annabell Key. An experienced assistant was required given the standard of surgical care given the complexity of the case.  This assistant was needed for exposure, dissection, suctioning, retraction, instrument exchange, and for overall help during the procedure.  ANESTHESIA:  General endotracheal, 0.25% marcaine  local   COMPLICATIONS:  None immediate.  ESTIMATED BLOOD LOSS:  10 ml.  FLUIDS: 1200 ml LR.  URINE OUTPUT:  250 ml of clear urine.  INDICATIONS: 38 y.o. [id number] with persistent right ovarian cysts and dermoid present since 2020. She has a history of a left ovarian dermoid.     FINDINGS:  Normal uterus, fallopian tubes, and left ovary. Right ovary with two simple appearing cysts and one dermoid.   TECHNIQUE:  The patient was taken to the operating room where general anesthesia was obtained without difficulty.  She was then placed in the dorsal lithotomy position and prepared and draped in sterile fashion.  An adequate time out was performed.   A catheter was placed and a hulka uterine manipulator was placed.   A skin incision was made with the 11 blade scalpel in the umbilicus. I entered her abdominal cavity with a veress needle using closed technique. Opening pressure was 3.  Pneumoperitoneum achieved to a pressure of 15.  The 5 mm optiview port was inserted into the abdomen under direct visualization. Below the point of entry and the pelvis was inspected with no evidence of injury.   The scalpel was then used to make one 5mm incision in the LLQ and one 5mm incision in the RLQ. Pedi-ports were inserted. The ureters were identified bilaterally. After inspectign the incision a transverse incision was made with the Harmonic scalpel. Using this, the maryland   grasper and the suction device we had removed the majority of the 6 cm simple cyst when it ruptured. The remaining cyst wall was easily removed. The other simple cyst also had ruptured at some point in the dissection and the cyst wall was removed. The Dermoid was then through the same incision, dissected around using the same technique. 50% of the dermoid was separated when it also ruptured. The remaining cyst was was removed and as the contents came out they were suctioned. Throughout our dissection caution was maintained to avoid the IP ligament. The cyst walls were all placed in an endo-catch bag and brought through the right port and removed in pieces. We changed gloves and cleaned the instruments.   The abdomen was then copiously irrigated with about 3 L of saline until the fluid showed no further fat and was clear. This was done also with alternating her position from trendelenburg to reverse.   The ovary was inspected and still hemostatic. Arista was placed over the ovarian bed. It was then wrapped in intercede.    All ports were then withdrawn and the gas drained from abdomen. They were then closed in a subcuticular fashion with 4-0 vicryl and dermabond was placed.  The patient will be discharged to home as per PACU criteria.  Routine postoperative instructions given.    Lacey Pian, MD Attending Obstetrician & Gynecologist, [facility] for [facility], [facility], [facility]

## 2023-09-14 NOTE — Anesthesia Preprocedure Evaluation (Addendum)
 Anesthesia Evaluation  Patient identified by MRN, date of birth, ID band Patient awake    Reviewed: Allergy & Precautions, NPO status , Patient's Chart, lab work & pertinent test results  History of Anesthesia Complications (+) PONV and history of anesthetic complications  Airway Mallampati: I  TM Distance: >3 FB Neck ROM: Full    Dental  (+) Dental Advisory Given, Missing,    Pulmonary neg pulmonary ROS   Pulmonary exam normal breath sounds clear to auscultation       Cardiovascular Exercise Tolerance: Good negative cardio ROS Normal cardiovascular exam Rhythm:Regular Rate:Normal     Neuro/Psych  PSYCHIATRIC DISORDERS Anxiety     negative neurological ROS     GI/Hepatic negative GI ROS, Neg liver ROS,,,  Endo/Other  negative endocrine ROS    Renal/GU negative Renal ROS     Musculoskeletal negative musculoskeletal ROS (+)    Abdominal   Peds  Hematology negative hematology ROS (+)   Anesthesia Other Findings Day of surgery medications reviewed with the patient.  Reproductive/Obstetrics Dermoid cyst of right ovary Adnexal mass                               Anesthesia Physical Anesthesia Plan  ASA: 2  Anesthesia Plan: General   Post-op Pain Management: Tylenol  PO (pre-op)* and Toradol  IV (intra-op)*   Induction: Intravenous  PONV Risk Score and Plan: 4 or greater and Scopolamine  patch - Pre-op, Midazolam , Dexamethasone  and Ondansetron   Airway Management Planned: Oral ETT  Additional Equipment:   Intra-op Plan:   Post-operative Plan: Extubation in OR  Informed Consent: I have reviewed the patients History and Physical, chart, labs and discussed the procedure including the risks, benefits and alternatives for the proposed anesthesia with the patient or authorized representative who has indicated his/her understanding and acceptance.     Dental advisory given  Plan  Discussed with: CRNA  Anesthesia Plan Comments:        Anesthesia Quick Evaluation

## 2023-09-14 NOTE — Anesthesia Postprocedure Evaluation (Signed)
 Anesthesia Post Note  Patient: Adrienne Madden  Procedure(s) Performed: EXCISION, CYST, OVARY, LAPAROSCOPIC (Right: Pelvis)     Patient location during evaluation: PACU Anesthesia Type: General Level of consciousness: awake and alert, patient cooperative and oriented Pain management: pain level controlled Vital Signs Assessment: post-procedure vital signs reviewed and stable Respiratory status: spontaneous breathing, nonlabored ventilation and respiratory function stable Cardiovascular status: blood pressure returned to baseline and stable Postop Assessment: no apparent nausea or vomiting Anesthetic complications: no   No notable events documented.  Last Vitals:  Vitals:   09/14/23 1800 09/14/23 1815  BP: 127/81 107/76  Pulse: 95 71  Resp: (!) 31 11  Temp:  36.8 C  SpO2: 93% 93%    Last Pain:  Vitals:   09/14/23 1815  TempSrc:   PainSc: 3                  Shreyas Piatkowski,E. Kalin Amrhein

## 2023-09-14 NOTE — Anesthesia Procedure Notes (Signed)
 Procedure Name: Intubation Date/Time: 09/14/2023 3:09 PM  Performed by: Erin Havers, MDPre-anesthesia Checklist: Patient identified, Emergency Drugs available, Suction available and Patient being monitored Patient Re-evaluated:Patient Re-evaluated prior to induction Oxygen Delivery Method: Circle system utilized Preoxygenation: Pre-oxygenation with 100% oxygen Induction Type: IV induction Ventilation: Mask ventilation without difficulty Laryngoscope Size: Mac and 3 Grade View: Grade I Tube type: Oral Number of attempts: 1 Airway Equipment and Method: Stylet Placement Confirmation: ETT inserted through vocal cords under direct vision, positive ETCO2 and breath sounds checked- equal and bilateral Secured at: 21 cm Tube secured with: Tape Dental Injury: Teeth and Oropharynx as per pre-operative assessment

## 2023-09-14 NOTE — Transfer of Care (Signed)
 Immediate Anesthesia Transfer of Care Note  Patient: Adrienne Madden  Procedure(s) Performed: EXCISION, CYST, OVARY, LAPAROSCOPIC (Right: Pelvis)  Patient Location: PACU  Anesthesia Type:General  Level of Consciousness: awake and responds to stimulation  Airway & Oxygen Therapy: Patient Spontanous Breathing and Patient connected to face mask oxygen  Post-op Assessment: Report given to RN and Post -op Vital signs reviewed and stable  Post vital signs: Reviewed and stable  Last Vitals:  Vitals Value Taken Time  BP 131/66 09/14/23 1724  Temp 36.8 C 09/14/23 1724  Pulse 93 09/14/23 1729  Resp 16 09/14/23 1729  SpO2 99 % 09/14/23 1729  Vitals shown include unfiled device data.  Last Pain:  Vitals:   09/14/23 1724  TempSrc:   PainSc: 0-No pain      Patients Stated Pain Goal: 8 (09/14/23 1117)  Complications: No notable events documented.

## 2023-09-15 ENCOUNTER — Encounter (HOSPITAL_COMMUNITY): Payer: Self-pay | Admitting: Obstetrics and Gynecology

## 2023-09-16 ENCOUNTER — Ambulatory Visit: Payer: Self-pay | Admitting: Obstetrics and Gynecology

## 2023-09-16 LAB — SURGICAL PATHOLOGY

## 2023-10-18 NOTE — Progress Notes (Deleted)
   GYNECOLOGY OFFICE VISIT NOTE  History:  Adrienne Madden is a 38 y.o. [phone] here today for postop check.   She had l/s ovarian cystectomy for dermoid on the right ovary.   She denies any abnormal vaginal discharge, bleeding, pelvic pain or other concerns.  The following portions of the patient's history were reviewed and updated as appropriate: allergies, current medications, past family history, past medical history, past social history, past surgical history and problem list.  Review of Systems:  Pertinent items noted in HPI and remainder of comprehensive ROS otherwise negative.  Physical Exam:  There were no vitals taken for this visit. CONSTITUTIONAL: Well-developed, well-nourished female in no acute distress.  HEENT:  Normocephalic, atraumatic. External right and left ear normal. No scleral icterus.  NECK: Normal range of motion, supple, no masses noted on observation SKIN: No rash noted. Not diaphoretic. No erythema. No pallor. MUSCULOSKELETAL: Normal range of motion. No edema noted. NEUROLOGIC: Alert and oriented to person, place, and time. Normal muscle tone coordination. No cranial nerve deficit noted. PSYCHIATRIC: Normal mood and affect. Normal behavior. Normal judgment and thought content.  CARDIOVASCULAR: Normal heart rate noted RESPIRATORY: Effort and breath sounds normal, no problems with respiration noted ABDOMEN: No masses noted. No other overt distention noted.  Incisions c/d/I ***  PELVIC: Deferred  Labs and Imaging No results found for this or any previous visit (from the past week). No results found.  Assessment and Plan:  1. Postop check (Primary) Healing well Reviewed pathology Reviewed risk of recurrence No restrictions.   2. Dermoid cyst of right ovary Resolved now.     Diagnoses and all orders for this visit:  Postop check  Dermoid cyst of right ovary     No orders of the defined types were placed in this encounter.    Routine  preventative health maintenance measures emphasized. Please refer to After Visit Summary for other counseling recommendations.   No follow-ups on file.  Vina Solian, MD, FACOG Obstetrician & Gynecologist, [facility] for [facility], [facility]

## 2023-10-20 ENCOUNTER — Telehealth: Payer: Self-pay | Admitting: *Deleted

## 2023-10-20 ENCOUNTER — Encounter: Admitting: Obstetrics and Gynecology

## 2023-10-20 DIAGNOSIS — Z09 Encounter for follow-up examination after completed treatment for conditions other than malignant neoplasm: Secondary | ICD-10-CM

## 2023-10-20 DIAGNOSIS — D270 Benign neoplasm of right ovary: Secondary | ICD-10-CM

## 2023-10-20 NOTE — Telephone Encounter (Signed)
 Returned call from 9:01 AM. Left patient a message to call and reschedule Post Op appointment with Dr. Cleatus per patient request.
